# Patient Record
Sex: Female | Born: 1960 | Race: White | Hispanic: No | Marital: Married | Smoking: Never smoker
Health system: Southern US, Community
[De-identification: ages and names within clinical notes are randomized; demographics above are authoritative.]

## PROBLEM LIST (undated history)

## (undated) DIAGNOSIS — I2510 Atherosclerotic heart disease of native coronary artery without angina pectoris: Secondary | ICD-10-CM

## (undated) HISTORY — PX: SHOULDER SURGERY: SHX246

---

## 2005-11-25 ENCOUNTER — Ambulatory Visit: Payer: Self-pay | Admitting: Internal Medicine

## 2005-11-28 ENCOUNTER — Ambulatory Visit: Payer: Self-pay | Admitting: Surgery

## 2006-11-11 HISTORY — PX: SKIN BIOPSY: SHX1

## 2008-01-15 ENCOUNTER — Ambulatory Visit: Payer: Self-pay | Admitting: Specialist

## 2008-03-23 ENCOUNTER — Other Ambulatory Visit: Payer: Self-pay

## 2008-03-23 ENCOUNTER — Ambulatory Visit: Payer: Self-pay | Admitting: Specialist

## 2008-03-30 ENCOUNTER — Ambulatory Visit: Payer: Self-pay | Admitting: Specialist

## 2009-07-22 ENCOUNTER — Emergency Department: Payer: Self-pay | Admitting: Emergency Medicine

## 2011-10-11 ENCOUNTER — Emergency Department: Payer: Self-pay | Admitting: Internal Medicine

## 2013-06-11 ENCOUNTER — Emergency Department: Payer: Self-pay | Admitting: Emergency Medicine

## 2013-06-11 LAB — COMPREHENSIVE METABOLIC PANEL
Albumin: 3.8 g/dL (ref 3.4–5.0)
Alkaline Phosphatase: 123 U/L (ref 50–136)
Anion Gap: 3 — ABNORMAL LOW (ref 7–16)
Bilirubin,Total: 0.4 mg/dL (ref 0.2–1.0)
Calcium, Total: 9.1 mg/dL (ref 8.5–10.1)
Chloride: 106 mmol/L (ref 98–107)
Co2: 30 mmol/L (ref 21–32)
Creatinine: 0.74 mg/dL (ref 0.60–1.30)
Glucose: 97 mg/dL (ref 65–99)
Osmolality: 277 (ref 275–301)
Potassium: 3.8 mmol/L (ref 3.5–5.1)
SGOT(AST): 27 U/L (ref 15–37)
Sodium: 139 mmol/L (ref 136–145)

## 2013-06-11 LAB — CBC
HCT: 38.7 % (ref 35.0–47.0)
MCH: 30.6 pg (ref 26.0–34.0)
WBC: 8 10*3/uL (ref 3.6–11.0)

## 2013-06-11 LAB — TROPONIN I: Troponin-I: <0.02 ng/mL

## 2013-06-11 LAB — LIPASE, BLOOD: Lipase: 109 U/L (ref 73–393)

## 2016-11-16 ENCOUNTER — Encounter: Payer: Self-pay | Admitting: Emergency Medicine

## 2016-11-16 ENCOUNTER — Emergency Department: Payer: 59

## 2016-11-16 ENCOUNTER — Emergency Department
Admission: EM | Admit: 2016-11-16 | Discharge: 2016-11-17 | Disposition: A | Discharge status: Home / Self Care | Payer: 59 | Attending: Emergency Medicine | Admitting: Emergency Medicine

## 2016-11-16 DIAGNOSIS — I10 Essential (primary) hypertension: Secondary | ICD-10-CM | POA: Diagnosis not present

## 2016-11-16 DIAGNOSIS — J069 Acute upper respiratory infection, unspecified: Secondary | ICD-10-CM | POA: Insufficient documentation

## 2016-11-16 DIAGNOSIS — I2510 Atherosclerotic heart disease of native coronary artery without angina pectoris: Secondary | ICD-10-CM | POA: Diagnosis not present

## 2016-11-16 DIAGNOSIS — B9789 Other viral agents as the cause of diseases classified elsewhere: Secondary | ICD-10-CM

## 2016-11-16 DIAGNOSIS — R05 Cough: Secondary | ICD-10-CM | POA: Diagnosis present

## 2016-11-16 HISTORY — DX: Atherosclerotic heart disease of native coronary artery without angina pectoris: I25.10

## 2016-11-16 LAB — BASIC METABOLIC PANEL
Anion gap: 7 (ref 5–15)
BUN: 12 mg/dL (ref 6–20)
CALCIUM: 8.8 mg/dL — AB (ref 8.9–10.3)
CHLORIDE: 105 mmol/L (ref 101–111)
CO2: 27 mmol/L (ref 22–32)
CREATININE: 0.57 mg/dL (ref 0.44–1.00)
GFR calc Af Amer: >60 mL/min (ref >60)
GFR calc non Af Amer: >60 mL/min (ref >60)
Glucose, Bld: 102 mg/dL — ABNORMAL HIGH (ref 65–99)
Potassium: 3.5 mmol/L (ref 3.5–5.1)
Sodium: 139 mmol/L (ref 135–145)

## 2016-11-16 LAB — CBC
HCT: 39.8 % (ref 35.0–47.0)
Hemoglobin: 13.4 g/dL (ref 12.0–16.0)
MCH: 30.8 pg (ref 26.0–34.0)
MCHC: 33.6 g/dL (ref 32.0–36.0)
MCV: 91.6 fL (ref 80.0–100.0)
PLATELETS: 255 10*3/uL (ref 150–440)
RBC: 4.35 MIL/uL (ref 3.80–5.20)
RDW: 13.2 % (ref 11.5–14.5)
WBC: 9.4 10*3/uL (ref 3.6–11.0)

## 2016-11-16 LAB — TROPONIN I: Troponin I: <0.03 ng/mL (ref <0.03)

## 2016-11-16 MED ORDER — ASPIRIN 81 MG PO CHEW
324.0000 mg | CHEWABLE_TABLET | Freq: Once | ORAL | Status: AC
  Administered 2016-11-17: 324 mg via ORAL
  Filled 2016-11-16: qty 4

## 2016-11-16 NOTE — ED Notes (Signed)
Per EDT Edison Nasuti patient needs labs drawn

## 2016-11-16 NOTE — ED Provider Notes (Signed)
[facility] Emergency Department Provider Note  ____________________________________________  Time seen: Approximately 11:43 PM  I have reviewed the triage vital signs and the nursing notes.   HISTORY  Chief Complaint Hypertension   HPI Tracey Fischer is a 56 y.o. female who presents for evaluation of elevated blood pressure. Patient reports that she was diagnosed with the flu during Christmas time. She was feeling better however the last 2 days she started having cough productive of yellow sputum and subjective fevers. She went to [facility] today to request antibiotics for her symptoms and she was found to be hypertensive with systolics in the [id number] and was sent to the emergency room for further evaluation. Patient is seen by Dr. Nehemiah Massed and reports that she had a Holter monitor and a stress test done within the last year. Unfortunately I am unable to see these results on Epic. She tells me that her stress test was normal and her Holter monitor showed some type of arrhythmia. Patient was not placed on any medications for blood pressure or the arrhythmia according to her. She has never had a left heart catheterization. She denies family history of cardiac disease, she is not a smoker. Patient endorses that she has ongoing chest tightness that happens every time she has fluttering of her heart. She reports that she has these episodes on a daily basis. No chest tightness at this time. She drinks 2 cups of coffee a day. No history of thyroid disease. These episodes have been going on for over a year now with no recent changes in their frequency or intensity.  Past Medical History:  Diagnosis Date  . Coronary artery disease     There are no active problems to display for this patient.   Past Surgical History:  Procedure Laterality Date  . CESAREAN SECTION    . SHOULDER SURGERY Left     Prior to Admission medications   Not on File    Allergies Patient  has no allergy information on record.  History reviewed. No pertinent family history.  Social History Social History  Substance Use Topics  . Smoking status: Never Smoker  . Smokeless tobacco: Never Used  . Alcohol use Not on file     Comment: rarely    Review of Systems  Constitutional: Negative for fever. Eyes: Negative for visual changes. ENT: Negative for sore throat. Neck: No neck pain  Cardiovascular: Negative for chest pain. Respiratory: Negative for shortness of breath. Gastrointestinal: Negative for abdominal pain, vomiting or diarrhea. Genitourinary: Negative for dysuria. Musculoskeletal: Negative for back pain. Skin: Negative for rash. Neurological: Negative for headaches, weakness or numbness. Psych: No SI or HI  ____________________________________________   PHYSICAL EXAM:  VITAL SIGNS: ED Triage Vitals  Enc Vitals Group     BP 11/16/16 1807 (!) 164/99     Pulse Rate 11/16/16 1807 94     Resp 11/16/16 1807 20     Temp 11/16/16 1807 98 F (36.7 C)     Temp Source 11/16/16 1807 Oral     SpO2 11/16/16 1807 100 %     Weight 11/16/16 1807 195 lb (88.5 kg)     Height 11/16/16 1807 5\' 3"  (1.6 m)     Head Circumference --      Peak Flow --      Pain Score 11/16/16 1808 2     Pain Loc --      Pain Edu? --      Excl. in [place]? --  Constitutional: Alert and oriented. Well appearing and in no apparent distress. HEENT:      Head: Normocephalic and atraumatic.         Eyes: Conjunctivae are normal. Sclera is non-icteric. EOMI. PERRL      Mouth/Throat: Mucous membranes are moist.       Neck: Supple with no signs of meningismus. Cardiovascular: Regular rate and rhythm. No murmurs, gallops, or rubs. 2+ symmetrical distal pulses are present in all extremities. No JVD. Respiratory: Normal respiratory effort. Lungs are clear to auscultation bilaterally. No wheezes, crackles, or rhonchi.  Gastrointestinal: Soft, non tender, and non distended with positive bowel  sounds. No rebound or guarding. Musculoskeletal: Nontender with normal range of motion in all extremities. No edema, cyanosis, or erythema of extremities. Neurologic: Normal speech and language. Face is symmetric. Moving all extremities. No gross focal neurologic deficits are appreciated. Skin: Skin is warm, dry and intact. No rash noted. Psychiatric: Mood and affect are normal. Speech and behavior are normal.  ____________________________________________   LABS (all labs ordered are listed, but only abnormal results are displayed)  Labs Reviewed  BASIC METABOLIC PANEL - Abnormal; Notable for the following:       Result Value   Glucose, Bld 102 (*)    Calcium 8.8 (*)    All other components within normal limits  CBC  TROPONIN I  TROPONIN I   ____________________________________________  EKG  ED ECG REPORT I, Rudene Re, the attending physician, personally viewed and interpreted this ECG.  Normal sinus rhythm, rate of 75, normal intervals, normal axis, no ST elevations or depressions. Unchanged from prior. ____________________________________________  RADIOLOGY  CXR:  Negative ____________________________________________   PROCEDURES  Procedure(s) performed: None Procedures Critical Care performed:  None ____________________________________________   INITIAL IMPRESSION / ASSESSMENT AND PLAN / ED COURSE  56 y.o. female who presents for evaluation of elevated blood pressure in the setting of two days of productive cough. Patient has no chest pain at this time, no headache, no back pain. Her vital show BP of 164/99. Her EKG with no ischemic changes. First troponin is negative. Chest x-ray with no evidence of pneumonia, lungs are clear to auscultation with normal work of breathing, no fever or leukocytosis therefore will not treat with abx. Patient is CP free  At this time. Will get 2nd troponin and if negative will dc home with f/u with Dr. Nehemiah Massed, patient's  cardiologist.  Clinical Course as of Nov 17 217  Nancy Fetter Nov 17, 2016  0216 Chest x-ray with no evidence of pneumonia, patient remains afebrile with normal pulse, normal white count. Presentation concerning for viral URI. We'll hold off on antibiotics at this time. Troponin 2 is negative. Recommended the patient follow-up with her PCP and Dr. Nehemiah Massed for further evaluation of these episodes of palpitations and chest tightness. Recommended that she return to the emergency room she has a fever, body aches, or any new symptoms concerning to her.  [CV]    Clinical Course User Index [CV] Rudene Re, MD    Pertinent labs & imaging results that were available during my care of the patient were reviewed by me and considered in my medical decision making (see chart for details).    ____________________________________________   FINAL CLINICAL IMPRESSION(S) / ED DIAGNOSES  Final diagnoses:  Viral URI with cough  Hypertension, unspecified type      NEW MEDICATIONS STARTED DURING THIS VISIT:  New Prescriptions   No medications on file     Note:  This document was  prepared using Systems analyst and may include unintentional dictation errors.    Rudene Re, MD 11/17/16 [phone]

## 2016-11-16 NOTE — ED Triage Notes (Signed)
[facility] and being told her bp was 180/110. Pt states she has hx of cardiac issues and that she has "fluttering" often. She states she did have some tightness in her chest today but attributed it to her cough. She also states she has had a headache today, as well as feeling lightheaded. NAD noted.

## 2016-11-17 LAB — TROPONIN I: Troponin I: <0.03 ng/mL (ref <0.03)

## 2016-11-17 NOTE — Discharge Instructions (Signed)

## 2016-12-17 ENCOUNTER — Other Ambulatory Visit: Payer: Self-pay | Admitting: Family Medicine

## 2016-12-17 DIAGNOSIS — Z1231 Encounter for screening mammogram for malignant neoplasm of breast: Secondary | ICD-10-CM

## 2016-12-26 ENCOUNTER — Ambulatory Visit (INDEPENDENT_AMBULATORY_CARE_PROVIDER_SITE_OTHER): Payer: 59 | Admitting: Internal Medicine

## 2016-12-26 ENCOUNTER — Encounter: Payer: Self-pay | Admitting: Internal Medicine

## 2016-12-26 ENCOUNTER — Encounter (INDEPENDENT_AMBULATORY_CARE_PROVIDER_SITE_OTHER): Payer: Self-pay

## 2016-12-26 VITALS — BP 144/88 | HR 101 | Wt 201.0 lb

## 2016-12-26 DIAGNOSIS — R400 Somnolence: Secondary | ICD-10-CM

## 2016-12-26 DIAGNOSIS — J4530 Mild persistent asthma, uncomplicated: Secondary | ICD-10-CM | POA: Diagnosis not present

## 2016-12-26 MED ORDER — BUDESONIDE-FORMOTEROL FUMARATE 160-4.5 MCG/ACT IN AERO: 2.0000 | INHALATION_SPRAY | Freq: Two times a day (BID) | RESPIRATORY_TRACT | 6 refills | Status: DC

## 2016-12-26 MED ORDER — ALBUTEROL SULFATE HFA 108 (90 BASE) MCG/ACT IN AERS: 2.0000 | INHALATION_SPRAY | RESPIRATORY_TRACT | 6 refills | Status: DC | PRN

## 2016-12-26 NOTE — Progress Notes (Signed)
[facility] Consultation      Date: 12/26/2016,   MRN# [id number] Tracey Fischer 1961-04-28 Code Status:  Code Status History    This patient does not have a recorded code status. Please follow your organizational policy for patients in this situation.     Hosp day:@LENGTHOFSTAYDAYS @ Referring MD: @ATDPROV @     PCP:      AdmissionWeight: 201 lb (91.2 kg)                 CurrentWeight: 201 lb (91.2 kg) Tracey Fischer is a 56 y.o. old female seen in consultation for SOB at the request of Dr Kary Kos     CHIEF COMPLAINT:   SOB   HISTORY OF PRESENT ILLNESS  56 yo pleasant white female seen today for SOB -chronic SOB for many years SOB with exertion Intermittent chest tightness  intermittent cough and wheezing Resolves with rest  Has presumed flu like symptoms 2 months ago, recurrent Bronchitis and persistent nonproductive cough She has tried OTC meds tylenol and mucinex with little benefit She has chronic WOB and SOB with exertion-but no acute distress  She has gained 50 pounds in 5 years Has non-refreshed sleep has occasional morning headaches Has increased BP in last several months' Has daytime sleepiness and fatigue    PAST MEDICAL HISTORY   Past Medical History:  Diagnosis Date  . Coronary artery disease      SURGICAL HISTORY   Past Surgical History:  Procedure Laterality Date  . CESAREAN SECTION    . SHOULDER SURGERY Left      FAMILY HISTORY   History reviewed. No pertinent family history.  SISTER +ASTHMA  SIS SOCIAL HISTORY   Social History  Substance Use Topics  . Smoking status: Never Smoker  . Smokeless tobacco: Never Used  . Alcohol use Not on file     Comment: rarely     MEDICATIONS    Home Medication:  Current Outpatient Rx  . Order #: [id number] Class: Historical Med    Current Medication:  Current Outpatient Prescriptions:  .  vitamin B-12 (CYANOCOBALAMIN) 1000 MCG tablet, Take 1,000 mcg by mouth  daily., Disp: , Rfl:     ALLERGIES   Patient has no known allergies.     REVIEW OF SYSTEMS   Review of Systems  Constitutional: Positive for malaise/fatigue. Negative for chills, diaphoresis, fever and weight loss.  HENT: Negative for congestion and hearing loss.   Eyes: Negative for blurred vision and double vision.  Respiratory: Positive for cough and shortness of breath. Negative for hemoptysis, sputum production and wheezing.   Cardiovascular: Negative for chest pain, palpitations and orthopnea.  Gastrointestinal: Negative for abdominal pain, heartburn, nausea and vomiting.  Genitourinary: Negative for dysuria and urgency.  Musculoskeletal: Negative for back pain, myalgias and neck pain.  Skin: Negative for rash.  Neurological: Negative for dizziness, tingling, tremors, weakness and headaches.  Endo/Heme/Allergies: Does not bruise/bleed easily.  Psychiatric/Behavioral: Negative for depression, substance abuse and suicidal ideas.  All other systems reviewed and are negative.    VS: BP (!) 144/88 (BP Location: Left Arm, Cuff Size: Normal)   Pulse (!) 101   Wt 201 lb (91.2 kg)   SpO2 98%   BMI 35.61 kg/m      PHYSICAL EXAM  Physical Exam  Constitutional: She is oriented to person, place, and time. She appears well-developed and well-nourished. No distress.  HENT:  Head: Normocephalic and atraumatic.  Mouth/Throat: No oropharyngeal exudate.  Eyes: EOM are normal. Pupils are  equal, round, and reactive to light. No scleral icterus.  Neck: Normal range of motion. Neck supple.  Cardiovascular: Normal rate, regular rhythm and normal heart sounds.   No murmur heard. Pulmonary/Chest: No stridor. No respiratory distress. She has no wheezes.  Abdominal: Soft. Bowel sounds are normal.  Musculoskeletal: Normal range of motion. She exhibits no edema.  Neurological: She is alert and oriented to person, place, and time. No cranial nerve deficit.  Skin: Skin is warm. She is not  diaphoretic.  Psychiatric: She has a normal mood and affect.            IMAGING   CXR 11/2016 No acutue process no effusions, no opacities Images reviewed 12/26/2016  CXR  ASSESSMENT/PLAN   56 yo pleasant overweight WF with signs and symptoms of intermittent reactive airways disease with persistent viral infections in the setting of increased weight gain and probable underlying OSA  1.check sleep study with ONO 2.start symbicort and albuterol as needed 3.recommend weight loss, diet and exercise  Follow up in 4 weeks   I have personally obtained a history, examined the patient, evaluated laboratory and independently reviewed imaging results, formulated the assessment and plan and placed orders.  The Patient requires high complexity decision making for assessment and support, frequent evaluation and titration of therapies, application of advanced monitoring technologies and extensive interpretation of multiple databases.   Patient  satisfied with Plan of action and management. All questions answered  Corrin Parker, M.D.  [facility]  Medical Director [place] Director [facility] Cardio-Pulmonary Department

## 2016-12-26 NOTE — Patient Instructions (Signed)
Start Symbicort Inhalers Albuterol as needed Will need Split Night Study

## 2016-12-30 ENCOUNTER — Encounter: Payer: Self-pay | Admitting: Internal Medicine

## 2016-12-30 DIAGNOSIS — J4530 Mild persistent asthma, uncomplicated: Secondary | ICD-10-CM

## 2017-01-06 ENCOUNTER — Encounter: Payer: Self-pay | Admitting: Radiology

## 2017-01-06 ENCOUNTER — Ambulatory Visit
Admission: RE | Admit: 2017-01-06 | Discharge: 2017-01-06 | Disposition: A | Discharge status: Home / Self Care | Payer: 59 | Source: Ambulatory Visit | Attending: Family Medicine | Admitting: Family Medicine

## 2017-01-06 DIAGNOSIS — Z1231 Encounter for screening mammogram for malignant neoplasm of breast: Secondary | ICD-10-CM

## 2017-01-08 ENCOUNTER — Telehealth: Payer: Self-pay | Admitting: *Deleted

## 2017-01-08 NOTE — Telephone Encounter (Signed)
Pt informed ONO was negative. nothing further needed.

## 2017-01-14 ENCOUNTER — Other Ambulatory Visit: Payer: Self-pay | Admitting: Family Medicine

## 2017-01-14 DIAGNOSIS — R928 Other abnormal and inconclusive findings on diagnostic imaging of breast: Secondary | ICD-10-CM

## 2017-01-14 DIAGNOSIS — N6310 Unspecified lump in the right breast, unspecified quadrant: Secondary | ICD-10-CM

## 2017-01-21 ENCOUNTER — Ambulatory Visit
Admission: RE | Admit: 2017-01-21 | Discharge: 2017-01-21 | Disposition: A | Discharge status: Home / Self Care | Payer: 59 | Source: Ambulatory Visit | Attending: Family Medicine | Admitting: Family Medicine

## 2017-01-21 DIAGNOSIS — R928 Other abnormal and inconclusive findings on diagnostic imaging of breast: Secondary | ICD-10-CM

## 2017-01-21 DIAGNOSIS — N63 Unspecified lump in unspecified breast: Secondary | ICD-10-CM | POA: Diagnosis present

## 2017-01-21 DIAGNOSIS — N6310 Unspecified lump in the right breast, unspecified quadrant: Secondary | ICD-10-CM

## 2017-01-22 ENCOUNTER — Telehealth: Payer: Self-pay | Admitting: Internal Medicine

## 2017-01-22 DIAGNOSIS — G4719 Other hypersomnia: Secondary | ICD-10-CM

## 2017-01-22 NOTE — Telephone Encounter (Signed)
Order placed for HST since that is insurance preference. Nothing further needed.

## 2017-01-22 NOTE — Telephone Encounter (Signed)
Ria Comment with [facility] called and stated that patient's insurance prefers that patient have a HST instead of an in lab study.  Please cancel order for in lab and place HST order.  Thanks. Rhonda J Cobb

## 2018-09-04 ENCOUNTER — Other Ambulatory Visit (HOSPITAL_COMMUNITY): Payer: Self-pay | Admitting: Internal Medicine

## 2018-09-04 DIAGNOSIS — R0789 Other chest pain: Secondary | ICD-10-CM

## 2018-09-25 ENCOUNTER — Ambulatory Visit (HOSPITAL_COMMUNITY)
Admission: RE | Admit: 2018-09-25 | Discharge: 2018-09-25 | Disposition: A | Discharge status: Home / Self Care | Payer: 59 | Source: Ambulatory Visit | Attending: Internal Medicine | Admitting: Internal Medicine

## 2018-11-26 ENCOUNTER — Telehealth (HOSPITAL_COMMUNITY): Payer: Self-pay | Admitting: Emergency Medicine

## 2018-11-26 NOTE — Telephone Encounter (Signed)
Reaching out to patient to offer assistance regarding upcoming cardiac imaging study; pt verbalizes understanding of appt date/time, parking situation and where to check in, and verified current allergies; name and call back number provided for further questions should they Darnelle Maffucci RN [place] [phone]

## 2018-11-27 ENCOUNTER — Ambulatory Visit (HOSPITAL_COMMUNITY)
Admission: RE | Admit: 2018-11-27 | Discharge: 2018-11-27 | Disposition: A | Discharge status: Home / Self Care | Payer: 59 | Source: Ambulatory Visit | Attending: Internal Medicine | Admitting: Internal Medicine

## 2018-11-27 DIAGNOSIS — R0789 Other chest pain: Secondary | ICD-10-CM | POA: Insufficient documentation

## 2018-11-27 DIAGNOSIS — R0600 Dyspnea, unspecified: Secondary | ICD-10-CM

## 2018-11-27 MED ORDER — GADOBUTROL 1 MMOL/ML IV SOLN
10.0000 mL | Freq: Once | INTRAVENOUS | Status: AC | PRN
  Administered 2018-11-27: 10 mL via INTRAVENOUS

## 2021-06-12 ENCOUNTER — Encounter: Payer: Self-pay | Admitting: Dermatology

## 2021-06-12 ENCOUNTER — Other Ambulatory Visit: Payer: Self-pay

## 2021-06-12 ENCOUNTER — Ambulatory Visit: Payer: BC Managed Care – PPO | Admitting: Dermatology

## 2021-06-12 DIAGNOSIS — L578 Other skin changes due to chronic exposure to nonionizing radiation: Secondary | ICD-10-CM

## 2021-06-12 DIAGNOSIS — D1800 Hemangioma unspecified site: Secondary | ICD-10-CM | POA: Diagnosis not present

## 2021-06-12 DIAGNOSIS — L821 Other seborrheic keratosis: Secondary | ICD-10-CM | POA: Diagnosis not present

## 2021-06-12 DIAGNOSIS — Z1283 Encounter for screening for malignant neoplasm of skin: Secondary | ICD-10-CM | POA: Diagnosis not present

## 2021-06-12 DIAGNOSIS — L814 Other melanin hyperpigmentation: Secondary | ICD-10-CM

## 2021-06-12 NOTE — Patient Instructions (Addendum)
Seborrheic Keratosis  What causes seborrheic keratoses? Seborrheic keratoses are harmless, common skin growths that first appear during adult life.  As time goes by, more growths appear.  Some people may develop a large number of them.  Seborrheic keratoses appear on both covered and uncovered body parts.  They are not caused by sunlight.  The tendency to develop seborrheic keratoses can be inherited.  They vary in color from skin-colored to gray, brown, or even black.  They can be either smooth or have a rough, warty surface.   Seborrheic keratoses are superficial and look as if they were stuck on the skin.  Under the microscope this type of keratosis looks like layers upon layers of skin.  That is why at times the top layer may seem to fall off, but the rest of the growth remains and re-grows.    Treatment Seborrheic keratoses do not need to be treated, but can easily be removed in the office.  Seborrheic keratoses often cause symptoms when they rub on clothing or jewelry.  Lesions can be in the way of shaving.  If they become inflamed, they can cause itching, soreness, or burning.  Removal of a seborrheic keratosis can be accomplished by freezing, burning, or surgery. If any spot bleeds, scabs, or grows rapidly, please return to have it checked, as these can be an indication of a skin cancer.  Recommend daily broad spectrum sunscreen SPF 30+ to sun-exposed areas, reapply every 2 hours as needed. Call for new or changing lesions.  Staying in the shade or wearing long sleeves, sun glasses (UVA+UVB protection) and wide brim hats (4-inch brim around the entire circumference of the hat) are also recommended for sun protection.    If you have any questions or concerns for your doctor, please call our main line at [phone] and press option 4 to reach your doctor's medical assistant. If no one answers, please leave a voicemail as directed and we will return your call as soon as possible. Messages left  after 4 pm will be answered the following business day.   You may also send [place] a message via [place]. We typically respond to MyChart messages within 1-2 business days.  For prescription refills, please ask your pharmacy to contact our office. Our fax number is [phone].  If you have an urgent issue when the clinic is closed that cannot wait until the next business day, you can page your doctor at the number below.    Please note that while we do our best to be available for urgent issues outside of office hours, we are not available 24/7.   If you have an urgent issue and are unable to reach [place], you may choose to seek medical care at your doctor's office, retail clinic, urgent care center, or emergency room.  If you have a medical emergency, please immediately call 911 or go to the emergency department.  Pager Numbers  - Dr. Nehemiah Massed: [phone]  - Dr. Laurence Ferrari: [phone]  - Dr. Nicole Kindred: [phone]  In the event of inclement weather, please call our main line at [phone] for an update on the status of any delays or closures.  Dermatology Medication Tips: Please keep the boxes that topical medications come in in order to help keep track of the instructions about where and how to use these. Pharmacies typically print the medication instructions only on the boxes and not directly on the medication tubes.   If your medication is too expensive, please contact our office at  [phone] option 4 or send [place] a message through [place].   We are unable to tell what your co-pay for medications will be in advance as this is different depending on your insurance coverage. However, we may be able to find a substitute medication at lower cost or fill out paperwork to get insurance to cover a needed medication.   If a prior authorization is required to get your medication covered by your insurance company, please allow [place] 1-2 business days to complete this process.  Drug prices often  vary depending on where the prescription is filled and some pharmacies may offer cheaper prices.  The website www.goodrx.com contains coupons for medications through different pharmacies. The prices here do not account for what the cost may be with help from insurance (it may be cheaper with your insurance), but the website can give you the price if you did not use any insurance.  - You can print the associated coupon and take it with your prescription to the pharmacy.  - You may also stop by our office during regular business hours and pick up a GoodRx coupon card.  - If you need your prescription sent electronically to a different pharmacy, notify our office through [place] or by phone at [phone] option 4.
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
## 2022-03-13 ENCOUNTER — Other Ambulatory Visit: Payer: Self-pay | Admitting: Family Medicine

## 2022-03-13 DIAGNOSIS — N6310 Unspecified lump in the right breast, unspecified quadrant: Secondary | ICD-10-CM

## 2022-04-03 ENCOUNTER — Ambulatory Visit
Admission: RE | Admit: 2022-04-03 | Discharge: 2022-04-03 | Disposition: A | Discharge status: Home / Self Care | Payer: BC Managed Care – PPO | Source: Ambulatory Visit | Attending: Family Medicine | Admitting: Family Medicine

## 2022-04-03 DIAGNOSIS — N6310 Unspecified lump in the right breast, unspecified quadrant: Secondary | ICD-10-CM

## 2022-06-18 ENCOUNTER — Ambulatory Visit: Payer: BC Managed Care – PPO | Admitting: Dermatology

## 2022-06-18 DIAGNOSIS — D1800 Hemangioma unspecified site: Secondary | ICD-10-CM

## 2022-06-18 DIAGNOSIS — D3617 Benign neoplasm of peripheral nerves and autonomic nervous system of trunk, unspecified: Secondary | ICD-10-CM

## 2022-06-18 DIAGNOSIS — Z1283 Encounter for screening for malignant neoplasm of skin: Secondary | ICD-10-CM | POA: Diagnosis not present

## 2022-06-18 DIAGNOSIS — S80862A Insect bite (nonvenomous), left lower leg, initial encounter: Secondary | ICD-10-CM

## 2022-06-18 DIAGNOSIS — W57XXXA Bitten or stung by nonvenomous insect and other nonvenomous arthropods, initial encounter: Secondary | ICD-10-CM

## 2022-06-18 DIAGNOSIS — L578 Other skin changes due to chronic exposure to nonionizing radiation: Secondary | ICD-10-CM

## 2022-06-18 DIAGNOSIS — L820 Inflamed seborrheic keratosis: Secondary | ICD-10-CM | POA: Diagnosis not present

## 2022-06-18 DIAGNOSIS — D229 Melanocytic nevi, unspecified: Secondary | ICD-10-CM

## 2022-06-18 DIAGNOSIS — I8393 Asymptomatic varicose veins of bilateral lower extremities: Secondary | ICD-10-CM

## 2022-06-18 DIAGNOSIS — L821 Other seborrheic keratosis: Secondary | ICD-10-CM

## 2022-06-18 DIAGNOSIS — D3610 Benign neoplasm of peripheral nerves and autonomic nervous system, unspecified: Secondary | ICD-10-CM

## 2022-06-18 DIAGNOSIS — L814 Other melanin hyperpigmentation: Secondary | ICD-10-CM

## 2022-06-18 NOTE — Patient Instructions (Addendum)
Seborrheic Keratosis  What causes seborrheic keratoses? Seborrheic keratoses are harmless, common skin growths that first appear during adult life.  As time goes by, more growths appear.  Some people may develop a large number of them.  Seborrheic keratoses appear on both covered and uncovered body parts.  They are not caused by sunlight.  The tendency to develop seborrheic keratoses can be inherited.  They vary in color from skin-colored to gray, brown, or even black.  They can be either smooth or have a rough, warty surface.   Seborrheic keratoses are superficial and look as if they were stuck on the skin.  Under the microscope this type of keratosis looks like layers upon layers of skin.  That is why at times the top layer may seem to fall off, but the rest of the growth remains and re-grows.    Treatment Seborrheic keratoses do not need to be treated, but can easily be removed in the office.  Seborrheic keratoses often cause symptoms when they rub on clothing or jewelry.  Lesions can be in the way of shaving.  If they become inflamed, they can cause itching, soreness, or burning.  Removal of a seborrheic keratosis can be accomplished by freezing, burning, or surgery. If any spot bleeds, scabs, or grows rapidly, please return to have it checked, as these can be an indication of a skin cancer.  Cryotherapy Aftercare  Wash gently with soap and water everyday.   Apply Vaseline and Band-Aid daily until healed.       Melanoma ABCDEs  Melanoma is the most dangerous type of skin cancer, and is the leading cause of death from skin disease.  You are more likely to develop melanoma if you: Have light-colored skin, light-colored eyes, or red or blond hair Spend a lot of time in the sun Tan regularly, either outdoors or in a tanning bed Have had blistering sunburns, especially during childhood Have a close family member who has had a melanoma Have atypical moles or large birthmarks  Early  detection of melanoma is key since treatment is typically straightforward and cure rates are extremely high if we catch it early.   The first sign of melanoma is often a change in a mole or a new dark spot.  The ABCDE system is a way of remembering the signs of melanoma.  A for asymmetry:  The two halves do not match. B for border:  The edges of the growth are irregular. C for color:  A mixture of colors are present instead of an even brown color. D for diameter:  Melanomas are usually (but not always) greater than 6mm - the size of a pencil eraser. E for evolution:  The spot keeps changing in size, shape, and color.  Please check your skin once per month between visits. You can use a small mirror in front and a large mirror behind you to keep an eye on the back side or your body.   If you see any new or changing lesions before your next follow-up, please call to schedule a visit.  Please continue daily skin protection including broad spectrum sunscreen SPF 30+ to sun-exposed areas, reapplying every 2 hours as needed when you're outdoors.   Staying in the shade or wearing long sleeves, sun glasses (UVA+UVB protection) and wide brim hats (4-inch brim around the entire circumference of the hat) are also recommended for sun protection.     Due to recent changes in healthcare laws, you may see results of   your pathology and/or laboratory studies on MyChart before the doctors have had a chance to review them. We understand that in some cases there may be results that are confusing or concerning to you. Please understand that not all results are received at the same time and often the doctors may need to interpret multiple results in order to provide you with the best plan of care or course of treatment. Therefore, we ask that you please give us 2 business days to thoroughly review all your results before contacting the office for clarification. Should we see a critical lab result, you will be contacted  sooner.   If You Need Anything After Your Visit  If you have any questions or concerns for your doctor, please call our main line at [phone] and press option 4 to reach your doctor's medical assistant. If no one answers, please leave a voicemail as directed and we will return your call as soon as possible. Messages left after 4 pm will be answered the following business day.   You may also send us a message via MyChart. We typically respond to MyChart messages within 1-2 business days.  For prescription refills, please ask your pharmacy to contact our office. Our fax number is [phone].  If you have an urgent issue when the clinic is closed that cannot wait until the next business day, you can page your doctor at the number below.    Please note that while we do our best to be available for urgent issues outside of office hours, we are not available 24/7.   If you have an urgent issue and are unable to reach us, you may choose to seek medical care at your doctor's office, retail clinic, urgent care center, or emergency room.  If you have a medical emergency, please immediately call 911 or go to the emergency department.  Pager Numbers  - Dr. Kowalski: [phone]  - Dr. Moye: [phone]  - Dr. Stewart: [phone]  In the event of inclement weather, please call our main line at [phone] for an update on the status of any delays or closures.  Dermatology Medication Tips: Please keep the boxes that topical medications come in in order to help keep track of the instructions about where and how to use these. Pharmacies typically print the medication instructions only on the boxes and not directly on the medication tubes.   If your medication is too expensive, please contact our office at [phone] option 4 or send us a message through MyChart.   We are unable to tell what your co-pay for medications will be in advance as this is different depending on your insurance  coverage. However, we may be able to find a substitute medication at lower cost or fill out paperwork to get insurance to cover a needed medication.   If a prior authorization is required to get your medication covered by your insurance company, please allow us 1-2 business days to complete this process.  Drug prices often vary depending on where the prescription is filled and some pharmacies may offer cheaper prices.  The website www.goodrx.com contains coupons for medications through different pharmacies. The prices here do not account for what the cost may be with help from insurance (it may be cheaper with your insurance), but the website can give you the price if you did not use any insurance.  - You can print the associated coupon and take it with your prescription to the pharmacy.  - You may also stop   by our office during regular business hours and pick up a GoodRx coupon card.  - If you need your prescription sent electronically to a different pharmacy, notify our office through [facility] MyChart or by phone at [phone] option 4.     Si Usted Necesita Algo Despus de Su Visita  Tambin puede enviarnos un mensaje a travs de MyChart. Por lo general respondemos a los mensajes de MyChart en el transcurso de 1 a 2 das hbiles.  Para renovar recetas, por favor pida a su farmacia que se ponga en contacto con nuestra oficina. Nuestro nmero de fax es el [phone].  Si tiene un asunto urgente cuando la clnica est cerrada y que no puede esperar hasta el siguiente da hbil, puede llamar/localizar a su doctor(a) al nmero que aparece a continuacin.   Por favor, tenga en cuenta que aunque hacemos todo lo posible para estar disponibles para asuntos urgentes fuera del horario de oficina, no estamos disponibles las 24 horas del da, los 7 das de la semana.   Si tiene un problema urgente y no puede comunicarse con nosotros, puede optar por buscar atencin mdica  en el consultorio de  su doctor(a), en una clnica privada, en un centro de atencin urgente o en una sala de emergencias.  Si tiene una emergencia mdica, por favor llame inmediatamente al 911 o vaya a la sala de emergencias.  Nmeros de bper  - Dr. Kowalski: [phone]  - Dra. Moye: [phone]  - Dra. Stewart: [phone]  En caso de inclemencias del tiempo, por favor llame a nuestra lnea principal al [phone] para una actualizacin sobre el estado de cualquier retraso o cierre.  Consejos para la medicacin en dermatologa: Por favor, guarde las cajas en las que vienen los medicamentos de uso tpico para ayudarle a seguir las instrucciones sobre dnde y cmo usarlos. Las farmacias generalmente imprimen las instrucciones del medicamento slo en las cajas y no directamente en los tubos del medicamento.   Si su medicamento es muy caro, por favor, pngase en contacto con nuestra oficina llamando al [phone] y presione la opcin 4 o envenos un mensaje a travs de MyChart.   No podemos decirle cul ser su copago por los medicamentos por adelantado ya que esto es diferente dependiendo de la cobertura de su seguro. Sin embargo, es posible que podamos encontrar un medicamento sustituto a menor costo o llenar un formulario para que el seguro cubra el medicamento que se considera necesario.   Si se requiere una autorizacin previa para que su compaa de seguros cubra su medicamento, por favor permtanos de 1 a 2 das hbiles para completar este proceso.  Los precios de los medicamentos varan con frecuencia dependiendo del lugar de dnde se surte la receta y alguna farmacias pueden ofrecer precios ms baratos.  El sitio web www.goodrx.com tiene cupones para medicamentos de diferentes farmacias. Los precios aqu no tienen en cuenta lo que podra costar con la ayuda del seguro (puede ser ms barato con su seguro), pero el sitio web puede darle el precio si no utiliz ningn seguro.  - Puede imprimir el  cupn correspondiente y llevarlo con su receta a la farmacia.  - Tambin puede pasar por nuestra oficina durante el horario de atencin regular y recoger una tarjeta de cupones de GoodRx.  - Si necesita que su receta se enve electrnicamente a una farmacia diferente, informe a nuestra oficina a travs de MyChart de [facility] o por telfono llamando al [phone] y presione la opcin 4.  
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
## 2022-08-20 ENCOUNTER — Ambulatory Visit: Payer: BC Managed Care – PPO | Admitting: Dermatology

## 2023-03-13 ENCOUNTER — Other Ambulatory Visit: Payer: Self-pay | Admitting: Family Medicine

## 2023-03-13 ENCOUNTER — Ambulatory Visit
Admission: RE | Admit: 2023-03-13 | Discharge: 2023-03-13 | Disposition: A | Discharge status: Home / Self Care | Payer: BC Managed Care – PPO | Source: Ambulatory Visit | Attending: Family Medicine | Admitting: Family Medicine

## 2023-03-13 DIAGNOSIS — G4485 Primary stabbing headache: Secondary | ICD-10-CM

## 2023-04-08 ENCOUNTER — Other Ambulatory Visit: Payer: Self-pay | Admitting: Neurology

## 2023-04-08 DIAGNOSIS — Z8249 Family history of ischemic heart disease and other diseases of the circulatory system: Secondary | ICD-10-CM

## 2023-04-08 DIAGNOSIS — R519 Headache, unspecified: Secondary | ICD-10-CM

## 2023-07-08 ENCOUNTER — Encounter: Payer: BC Managed Care – PPO | Admitting: Dermatology

## 2023-08-24 ENCOUNTER — Emergency Department: Payer: BC Managed Care – PPO

## 2023-08-24 ENCOUNTER — Emergency Department
Admission: EM | Admit: 2023-08-24 | Discharge: 2023-08-24 | Disposition: A | Discharge status: Home / Self Care | Payer: BC Managed Care – PPO | Attending: Emergency Medicine | Admitting: Emergency Medicine

## 2023-08-24 ENCOUNTER — Other Ambulatory Visit: Payer: Self-pay

## 2023-08-24 DIAGNOSIS — R1032 Left lower quadrant pain: Secondary | ICD-10-CM | POA: Diagnosis not present

## 2023-08-24 DIAGNOSIS — R630 Anorexia: Secondary | ICD-10-CM | POA: Diagnosis not present

## 2023-08-24 DIAGNOSIS — R110 Nausea: Secondary | ICD-10-CM | POA: Insufficient documentation

## 2023-08-24 DIAGNOSIS — K5792 Diverticulitis of intestine, part unspecified, without perforation or abscess without bleeding: Secondary | ICD-10-CM

## 2023-08-24 DIAGNOSIS — R109 Unspecified abdominal pain: Secondary | ICD-10-CM | POA: Diagnosis present

## 2023-08-24 LAB — CBC
HCT: 43.2 % (ref 36.0–46.0)
Hemoglobin: 13.8 g/dL (ref 12.0–15.0)
MCH: 30.3 pg (ref 26.0–34.0)
MCHC: 31.9 g/dL (ref 30.0–36.0)
MCV: 94.9 fL (ref 80.0–100.0)
Platelets: 330 10*3/uL (ref 150–400)
RBC: 4.55 MIL/uL (ref 3.87–5.11)
RDW: 13.1 % (ref 11.5–15.5)
WBC: 17.4 10*3/uL — ABNORMAL HIGH (ref 4.0–10.5)
nRBC: 0 % (ref 0.0–0.2)

## 2023-08-24 LAB — COMPREHENSIVE METABOLIC PANEL
ALT: 34 U/L (ref 0–44)
AST: 44 U/L — ABNORMAL HIGH (ref 15–41)
Albumin: 3.9 g/dL (ref 3.5–5.0)
Alkaline Phosphatase: 114 U/L (ref 38–126)
Anion gap: 10 (ref 5–15)
BUN: 9 mg/dL (ref 8–23)
CO2: 25 mmol/L (ref 22–32)
Calcium: 8.8 mg/dL — ABNORMAL LOW (ref 8.9–10.3)
Chloride: 100 mmol/L (ref 98–111)
Creatinine, Ser: 0.66 mg/dL (ref 0.44–1.00)
GFR, Estimated: >60 mL/min (ref >60)
Glucose, Bld: 101 mg/dL — ABNORMAL HIGH (ref 70–99)
Potassium: 4.2 mmol/L (ref 3.5–5.1)
Sodium: 135 mmol/L (ref 135–145)
Total Bilirubin: 1.3 mg/dL — ABNORMAL HIGH (ref 0.3–1.2)
Total Protein: 8.3 g/dL — ABNORMAL HIGH (ref 6.5–8.1)

## 2023-08-24 LAB — URINALYSIS, ROUTINE W REFLEX MICROSCOPIC
Bilirubin Urine: NEGATIVE
Glucose, UA: NEGATIVE mg/dL
Hgb urine dipstick: NEGATIVE
Ketones, ur: NEGATIVE mg/dL
Leukocytes,Ua: NEGATIVE
Nitrite: NEGATIVE
Protein, ur: 30 mg/dL — AB
Specific Gravity, Urine: 1.029 (ref 1.005–1.030)
pH: 5 (ref 5.0–8.0)

## 2023-08-24 LAB — LIPASE, BLOOD: Lipase: 21 U/L (ref 11–51)

## 2023-08-24 MED ORDER — ONDANSETRON HCL 4 MG/2ML IJ SOLN: 4.0000 mg | INTRAMUSCULAR | Status: DC

## 2023-08-24 MED ORDER — MORPHINE SULFATE (PF) 4 MG/ML IV SOLN
4.0000 mg | Freq: Once | INTRAVENOUS | Status: AC
Start: 2023-08-24 — End: 2023-08-24
  Administered 2023-08-24: 4 mg via INTRAMUSCULAR
  Filled 2023-08-24: qty 1

## 2023-08-24 MED ORDER — METRONIDAZOLE 500 MG PO TABS: 500.0000 mg | ORAL_TABLET | Freq: Three times a day (TID) | ORAL | 0 refills | Status: AC

## 2023-08-24 MED ORDER — MORPHINE SULFATE (PF) 4 MG/ML IV SOLN
4.0000 mg | Freq: Once | INTRAVENOUS | Status: DC
Start: 2023-08-24 — End: 2023-08-24

## 2023-08-24 MED ORDER — ONDANSETRON 4 MG PO TBDP
4.0000 mg | ORAL_TABLET | Freq: Once | ORAL | Status: AC
  Administered 2023-08-24: 4 mg via ORAL
  Filled 2023-08-24: qty 1

## 2023-08-24 MED ORDER — PIPERACILLIN-TAZOBACTAM 3.375 G IVPB 30 MIN
3.3750 g | Freq: Once | INTRAVENOUS | Status: AC
  Administered 2023-08-24: 3.375 g via INTRAVENOUS
  Filled 2023-08-24: qty 50

## 2023-08-24 MED ORDER — ONDANSETRON 4 MG PO TBDP: 4.0000 mg | ORAL_TABLET | Freq: Three times a day (TID) | ORAL | 0 refills | Status: AC | PRN

## 2023-08-24 MED ORDER — OXYCODONE-ACETAMINOPHEN 5-325 MG PO TABS: 1.0000 | ORAL_TABLET | Freq: Four times a day (QID) | ORAL | 0 refills | Status: AC | PRN

## 2023-08-24 MED ORDER — IOHEXOL 300 MG/ML  SOLN
100.0000 mL | Freq: Once | INTRAMUSCULAR | Status: AC | PRN
Start: 2023-08-24 — End: 2023-08-24
  Administered 2023-08-24: 100 mL via INTRAVENOUS

## 2023-08-24 MED ORDER — CIPROFLOXACIN HCL 500 MG PO TABS: 500.0000 mg | ORAL_TABLET | Freq: Two times a day (BID) | ORAL | 0 refills | Status: AC

## 2023-08-24 NOTE — ED Triage Notes (Signed)
Pt comes with c/o belly pain since Friday. Pt states mid slower abdomen and radiates to back. Pt states it did started in LLQ. Pt states soreness.

## 2023-08-24 NOTE — ED Provider Notes (Signed)
[facility] Provider Note    Event Date/Time   First MD Initiated Contact with Patient 08/24/23 1726     (approximate)   History   Abdominal Pain   HPI Tracey Fischer is a 62 y.o. female who presents for evaluation of several days of gradually worsening abdominal pain.  She said that it started in her left lower quadrant and now is all throughout her abdomen but seems to be somewhat centralized around her bellybutton.  She has had a little bit of nausea today but no vomiting but she has lost her appetite and has not had much to eat or drink for a while.  She denies fever, chest pain, shortness of breath.  Initially she thought it would just get better with time and wondered if it was a urinary tract infection although she denies having any increased urinary frequency nor dysuria.  She has not had any diarrhea nor constipation.  Moving around or putting any pressure on her abdomen makes the pain worse.     Physical Exam   Triage Vital Signs: ED Triage Vitals  Encounter Vitals Group     BP 08/24/23 1508 (!) 152/75     Systolic BP Percentile --      Diastolic BP Percentile --      Pulse Rate 08/24/23 1508 95     Resp 08/24/23 1508 18     Temp 08/24/23 1508 98.3 F (36.8 C)     Temp src --      SpO2 08/24/23 1508 95 %     Weight --      Height --      Head Circumference --      Peak Flow --      Pain Score 08/24/23 1507 4     Pain Loc --      Pain Education --      Exclude from Growth Chart --     Most recent vital signs: Vitals:   08/24/23 1508  BP: (!) 152/75  Pulse: 95  Resp: 18  Temp: 98.3 F (36.8 C)  SpO2: 95%    General: Awake, appears uncomfortable but nontoxic. CV:  Good peripheral perfusion.  Regular rate and rhythm. Resp:  Normal effort. Speaking easily and comfortably, no accessory muscle usage nor intercostal retractions.   Abd:  No distention.  Exquisite tenderness to palpation throughout the abdomen concerning for  localized peritonitis around umbilicus.  She is tender to palpation both on the left and lower quadrants but guards and gasps with palpation to the middle of her abdomen.  No significant tenderness in the epigastrium nor the right upper quadrant.  She has rebound tenderness as well.   ED Results / Procedures / Treatments   Labs (all labs ordered are listed, but only abnormal results are displayed) Labs Reviewed  COMPREHENSIVE METABOLIC PANEL - Abnormal; Notable for the following components:      Result Value   Glucose, Bld 101 (*)    Calcium 8.8 (*)    Total Protein 8.3 (*)    AST 44 (*)    Total Bilirubin 1.3 (*)    All other components within normal limits  CBC - Abnormal; Notable for the following components:   WBC 17.4 (*)    All other components within normal limits  URINALYSIS, ROUTINE W REFLEX MICROSCOPIC - Abnormal; Notable for the following components:   Color, Urine AMBER (*)    APPearance HAZY (*)    Protein, ur 30 (*)  Bacteria, UA MANY (*)    All other components within normal limits  URINE CULTURE  LIPASE, BLOOD     RADIOLOGY CT of the abdomen and pelvis pending at time of transfer of care   PROCEDURES:  Critical Care performed: No  Procedures    IMPRESSION / MDM / ASSESSMENT AND PLAN / ED COURSE  I reviewed the triage vital signs and the nursing notes.                              Differential diagnosis includes, but is not limited to, diverticulitis, appendicitis, less likely UTI/pyelonephritis.  Patient's presentation is most consistent with acute presentation with potential threat to life or bodily function.  Labs/studies ordered: CMP, lipase, CBC, urinalysis, urine culture, CT of the abdomen and pelvis with IV contrast.  Interventions/Medications given:  Medications  iohexol (OMNIPAQUE) 300 MG/ML solution 100 mL (100 mLs Intravenous Contrast Given 08/24/23 2007)  morphine (PF) 4 MG/ML injection 4 mg (4 mg Intramuscular Given 08/24/23 1839)   ondansetron (ZOFRAN-ODT) disintegrating tablet 4 mg (4 mg Oral Given 08/24/23 1839)  piperacillin-tazobactam (ZOSYN) IVPB 3.375 g (0 g Intravenous Stopped 08/24/23 2016)    (Note:  hospital course my include additional interventions and/or labs/studies not listed above.)   Strongly suspect diverticulitis, likely perforated, although appendicitis is also possible.  We will place an IV and obtain a CT scan.  Her labs are essentially normal and reassuring except for very slight elevation of her AST but she has a leukocytosis of 17.4, further suggesting the possibility of infection.  Given the degree of localized peritonitis and her leukocytosis, I will treat empirically with Zosyn 3.375 g IV to begin antibiosis.  Awaiting imaging for more definitive results.     Clinical Course as of 08/24/23 2101  Sun Aug 24, 2023  1836 Difficulty obtaining IV access.  In the meantime I changed the order to morphine 4 mg intramuscular and Zofran 4 mg ODT and IV team is being consulted. [CF]  1930 Transferring ED care to [place], [place].   The plan is to follow-up on the CT of the abdomen and pelvis.  Assuming the patient has diverticulitis, this likely can be treated with outpatient antibiotics.  Should she have a surgical issue such as appendicitis or perforated diverticulitis, Ms. Joseph Art will consult with the appropriate specialist. [CF]    Clinical Course User Index [CF] Loleta Rose, MD     FINAL CLINICAL IMPRESSION(S) / ED DIAGNOSES   Final diagnoses:  Lower abdominal pain     Rx / DC Orders   ED Discharge Orders     None        Note:  This document was prepared using Dragon voice recognition software and may include unintentional dictation errors.   Loleta Rose, MD 08/24/23 2101

## 2023-08-24 NOTE — Discharge Instructions (Signed)
Take Cipro sent twice daily for 5 days. Take Flagyl 3 times daily for the next 5 days. Strive [place] for a soft diet over the next week. You can take Percocet with stool softener for pain. If pain medication causes you to have nausea, please take Zofran with it.

## 2023-08-24 NOTE — ED Notes (Signed)
See triage notes. Patient c/o LLQ abdominal pain that radiates to her back that began on Friday.

## 2023-08-24 NOTE — ED Notes (Signed)
Pt back from CT

## 2023-08-24 NOTE — ED Provider Notes (Signed)
Assumed patient care from Tracey Parish, MD.  I reassessed patient and she was resting comfortably.  CT abdomen pelvis indicates acute uncomplicated diverticulitis.  We discussed possible admission but patient states that she feels comfortable managing her symptoms at home.  Patient was started on ciprofloxacin and Flagyl.  She was prescribed a short course of Percocet for pain.  Return precautions were given to return to the emergency department if her pain worsens at home.  She voiced understanding and feels comfortable with this plan.   Pia Mau Four[place], Cordelia Poche 08/24/23 2116    Loleta Rose, MD 08/24/23 2120

## 2023-08-24 NOTE — ED Notes (Signed)
Pt to CT

## 2023-08-26 LAB — URINE CULTURE

## 2023-09-09 ENCOUNTER — Ambulatory Visit: Payer: BC Managed Care – PPO | Admitting: Dermatology

## 2023-09-09 DIAGNOSIS — D1801 Hemangioma of skin and subcutaneous tissue: Secondary | ICD-10-CM

## 2023-09-09 DIAGNOSIS — W908XXA Exposure to other nonionizing radiation, initial encounter: Secondary | ICD-10-CM | POA: Diagnosis not present

## 2023-09-09 DIAGNOSIS — L821 Other seborrheic keratosis: Secondary | ICD-10-CM

## 2023-09-09 DIAGNOSIS — L578 Other skin changes due to chronic exposure to nonionizing radiation: Secondary | ICD-10-CM

## 2023-09-09 DIAGNOSIS — D3610 Benign neoplasm of peripheral nerves and autonomic nervous system, unspecified: Secondary | ICD-10-CM

## 2023-09-09 DIAGNOSIS — I878 Other specified disorders of veins: Secondary | ICD-10-CM

## 2023-09-09 DIAGNOSIS — D3614 Benign neoplasm of peripheral nerves and autonomic nervous system of thorax: Secondary | ICD-10-CM

## 2023-09-09 DIAGNOSIS — D229 Melanocytic nevi, unspecified: Secondary | ICD-10-CM

## 2023-09-09 DIAGNOSIS — L814 Other melanin hyperpigmentation: Secondary | ICD-10-CM

## 2023-09-09 DIAGNOSIS — Z1283 Encounter for screening for malignant neoplasm of skin: Secondary | ICD-10-CM

## 2023-09-09 DIAGNOSIS — R238 Other skin changes: Secondary | ICD-10-CM

## 2023-09-09 DIAGNOSIS — L853 Xerosis cutis: Secondary | ICD-10-CM

## 2023-09-09 NOTE — Progress Notes (Unsigned)
   Follow-Up Visit   Subjective  Tracey Fischer is a 62 y.o. female who presents for the following: Skin Cancer Screening and Full Body Skin Exam  The patient presents for Total-Body Skin Exam (TBSE) for skin cancer screening and mole check. The patient has spots, moles and lesions to be evaluated, some may be new or changing. No history of skin cancer.     The following portions of the chart were reviewed this encounter and updated as appropriate: medications, allergies, medical history  Review of Systems:  No other skin or systemic complaints except as noted in HPI or Assessment and Plan.  Objective  Well appearing patient in no apparent distress; mood and affect are within normal limits.  A full examination was performed including scalp, head, eyes, ears, nose, lips, neck, chest, axillae, abdomen, back, buttocks, bilateral upper extremities, bilateral lower extremities, hands, feet, fingers, toes, fingernails, and toenails. All findings within normal limits unless otherwise noted below.   Relevant physical exam findings are noted in the Assessment and Plan.    Assessment & Plan   SKIN CANCER SCREENING PERFORMED TODAY.  ACTINIC DAMAGE - Chronic condition, secondary to cumulative UV/sun exposure - diffuse scaly erythematous macules with underlying dyspigmentation - Recommend daily broad spectrum sunscreen SPF 30+ to sun-exposed areas, reapply every 2 hours as needed.  - Staying in the shade or wearing long sleeves, sun glasses (UVA+UVB protection) and wide brim hats (4-inch brim around the entire circumference of the hat) are also recommended for sun protection.  - Call for new or changing lesions.  LENTIGINES, SEBORRHEIC KERATOSES, HEMANGIOMAS - Benign normal skin lesions - Benign-appearing - Call for any changes  MELANOCYTIC NEVI - Tan-brown and/or pink-flesh-colored symmetric macules and papules - Benign appearing on exam today - Observation - Call clinic for new or  changing moles - Recommend daily use of broad spectrum spf 30+ sunscreen to sun-exposed areas.   [place] Exam: small purple papule at central lower lip  Treatment Plan: Benign-appearing. Observe   Neurofibroma - right lateral chest Exam:   soft flesh papule   Benign-appearing.  Observation.  Call clinic for new or changing lesions.  Recommend daily use of broad spectrum spf 30+ sunscreen to sun-exposed areas.    Xerosis - diffuse xerotic patches - recommend gentle, hydrating skin care - gentle skin care handout given  Return in about 1 year (around 09/08/2024) for TBSE.  ICherlyn Labella, CMA, am acting as scribe for Willeen Niece, MD .   Documentation: I have reviewed the above documentation for accuracy and completeness, and I agree with the above.  Willeen Niece, MD

## 2023-09-09 NOTE — Patient Instructions (Addendum)
Gentle Skin Care Guide  1. Bathe no more than once a day.  2. Avoid bathing in hot water  3. Use a mild soap like Dove, Vanicream, Cetaphil, CeraVe. Can use Lever 2000 or Cetaphil antibacterial soap  4. Use soap only where you need it. On most days, use it under your arms, between your legs, and on your feet. Let the water rinse other areas unless visibly dirty.  5. When you get out of the bath/shower, use a towel to gently blot your skin dry, don't rub it.  6. While your skin is still a little damp, apply a moisturizing cream such as Vanicream, CeraVe, Cetaphil, Eucerin, Sarna lotion or plain Vaseline Jelly. For hands apply Neutrogena [place] Hand Cream or Excipial Hand Cream.  7. Reapply moisturizer any time you start to itch or feel dry.  8. Sometimes using free and clear laundry detergents can be helpful. Fabric softener sheets should be avoided. Downy Free & Gentle liquid, or any liquid fabric softener that is free of dyes and perfumes, it acceptable to use  9. If your doctor has given you prescription creams you may apply moisturizers over them      Melanoma ABCDEs  Melanoma is the most dangerous type of skin cancer, and is the leading cause of death from skin disease.  You are more likely to develop melanoma if you: Have light-colored skin, light-colored eyes, or red or blond hair Spend a lot of time in the sun Tan regularly, either outdoors or in a tanning bed Have had blistering sunburns, especially during childhood Have a close family member who has had a melanoma Have atypical moles or large birthmarks  Early detection of melanoma is key since treatment is typically straightforward and cure rates are extremely high if we catch it early.   The first sign of melanoma is often a change in a mole or a new dark spot.  The ABCDE system is a way of remembering the signs of melanoma.  A for asymmetry:  The two halves do not match. B for border:  The edges of the growth are  irregular. C for color:  A mixture of colors are present instead of an even brown color. D for diameter:  Melanomas are usually (but not always) greater than 6mm - the size of a pencil eraser. E for evolution:  The spot keeps changing in size, shape, and color.  Please check your skin once per month between visits. You can use a small mirror in front and a large mirror behind you to keep an eye on the back side or your body.   If you see any new or changing lesions before your next follow-up, please call to schedule a visit.  Please continue daily skin protection including broad spectrum sunscreen SPF 30+ to sun-exposed areas, reapplying every 2 hours as needed when you're outdoors.   Staying in the shade or wearing long sleeves, sun glasses (UVA+UVB protection) and wide brim hats (4-inch brim around the entire circumference of the hat) are also recommended for sun protection.    ______________________________________________________________   Due to recent changes in healthcare laws, you may see results of your pathology and/or laboratory studies on MyChart before the doctors have had a chance to review them. We understand that in some cases there may be results that are confusing or concerning to you. Please understand that not all results are received at the same time and often the doctors may need to interpret multiple results in order  to provide you with the best plan of care or course of treatment. Therefore, we ask that you please give [place] 2 business days to thoroughly review all your results before contacting the office for clarification. Should we see a critical lab result, you will be contacted sooner.   If You Need Anything After Your Visit  If you have any questions or concerns for your doctor, please call our main line at [phone] and press option 4 to reach your doctor's medical assistant. If no one answers, please leave a voicemail as directed and we will return your call as  soon as possible. Messages left after 4 pm will be answered the following business day.   You may also send [place] a message via MyChart. We typically respond to MyChart messages within 1-2 business days.  For prescription refills, please ask your pharmacy to contact our office. Our fax number is [phone].  If you have an urgent issue when the clinic is closed that cannot wait until the next business day, you can page your doctor at the number below.    Please note that while we do our best to be available for urgent issues outside of office hours, we are not available 24/7.   If you have an urgent issue and are unable to reach [place], you may choose to seek medical care at your doctor's office, retail clinic, urgent care center, or emergency room.  If you have a medical emergency, please immediately call 911 or go to the emergency department.  Pager Numbers  - Dr. Gwen Pounds: [phone]  - Dr. Roseanne Reno: [phone]  - Dr. Katrinka Blazing: [phone]   In the event of inclement weather, please call our main line at [phone] for an update on the status of any delays or closures.  Dermatology Medication Tips: Please keep the boxes that topical medications come in in order to help keep track of the instructions about where and how to use these. Pharmacies typically print the medication instructions only on the boxes and not directly on the medication tubes.   If your medication is too expensive, please contact our office at [phone] option 4 or send [place] a message through MyChart.   We are unable to tell what your co-pay for medications will be in advance as this is different depending on your insurance coverage. However, we may be able to find a substitute medication at lower cost or fill out paperwork to get insurance to cover a needed medication.   If a prior authorization is required to get your medication covered by your insurance company, please allow [place] 1-2 business days to complete this  process.  Drug prices often vary depending on where the prescription is filled and some pharmacies may offer cheaper prices.  The website www.goodrx.com contains coupons for medications through different pharmacies. The prices here do not account for what the cost may be with help from insurance (it may be cheaper with your insurance), but the website can give you the price if you did not use any insurance.  - You can print the associated coupon and take it with your prescription to the pharmacy.  - You may also stop by our office during regular business hours and pick up a GoodRx coupon card.  - If you need your prescription sent electronically to a different pharmacy, notify our office through [facility] or by phone at [phone] option 4.     Si Usted Necesita Algo Despus de Su Visita  Tambin puede enviarnos  un mensaje a travs de MyChart. Por lo general respondemos a los mensajes de MyChart en el transcurso de 1 a 2 das hbiles.  Para renovar recetas, por favor pida a su farmacia que se ponga en contacto con nuestra oficina. Annie Sable de fax es [place] [phone].  Si tiene un asunto urgente cuando la clnica est cerrada y que no puede esperar hasta el siguiente da hbil, puede llamar/localizar a su doctor(a) al nmero que aparece a continuacin.   Por favor, tenga en cuenta que aunque hacemos todo lo posible para estar disponibles para asuntos urgentes fuera del horario de [place], no estamos disponibles las 24 horas del da, los 7 [address]  [address] de la [place].   Si tiene un problema urgente y no puede comunicarse con nosotros, puede optar por buscar atencin mdica  en el consultorio de su doctor(a), en una clnica privada, en un centro de atencin urgente o en una sala de emergencias.  Si tiene Engineer, drilling, por favor llame inmediatamente al 911 o vaya a la sala de emergencias.  Nmeros de bper  - Dr. Gwen Pounds: [phone]  - Dra. Roseanne Reno: [phone]  - Dr.  Katrinka Blazing: [phone]   En caso de inclemencias del tiempo, por favor llame a Lacy Duverney principal al [phone] para una actualizacin sobre el Page de cualquier retraso o cierre.  Consejos para la medicacin en dermatologa: Por favor, guarde las cajas en las que vienen los medicamentos de uso tpico para ayudarle a seguir las instrucciones sobre dnde y cmo usarlos. Las farmacias generalmente imprimen las instrucciones del medicamento slo en las cajas y no directamente en los tubos del [place].   Si su medicamento es muy caro, por favor, pngase en contacto con Rolm Gala llamando al [phone] y presione la opcin 4 o envenos un mensaje a travs de Clinical cytogeneticist.   No podemos decirle cul ser su copago por los medicamentos por adelantado ya que esto es diferente dependiendo de la cobertura de su seguro. Sin embargo, es posible que podamos encontrar un medicamento sustituto a Audiological scientist un formulario para que el seguro cubra el medicamento que se considera necesario.   Si se requiere una autorizacin previa para que su compaa de seguros [place] su medicamento, por favor permtanos de 1 a 2 das hbiles para completar [address]  Los precios de los medicamentos varan con frecuencia dependiendo del Environmental consultant de dnde se surte la receta y alguna farmacias pueden ofrecer precios ms baratos.  El sitio web www.goodrx.com tiene cupones para medicamentos de Health and safety inspector. Los precios aqu no tienen en cuenta lo que podra costar con la ayuda del seguro (puede ser ms barato con su seguro), pero el sitio web puede darle el precio si no utiliz Tourist information centre manager.  - Puede imprimir el cupn correspondiente y llevarlo con su receta a la farmacia.  - Tambin puede pasar por nuestra oficina durante el horario de atencin regular y Education officer, museum una tarjeta de cupones de GoodRx.  - Si necesita que su receta se enve electrnicamente a una farmacia diferente, informe a nuestra oficina a  travs de MyChart de [place] o por telfono llamando al [phone] y presione la opcin 4.

## 2023-10-15 ENCOUNTER — Ambulatory Visit: Payer: BC Managed Care – PPO

## 2023-10-15 DIAGNOSIS — K635 Polyp of colon: Secondary | ICD-10-CM | POA: Diagnosis not present

## 2023-10-15 DIAGNOSIS — K5730 Diverticulosis of large intestine without perforation or abscess without bleeding: Secondary | ICD-10-CM | POA: Diagnosis not present

## 2023-10-15 DIAGNOSIS — Z1211 Encounter for screening for malignant neoplasm of colon: Secondary | ICD-10-CM | POA: Diagnosis present

## 2023-10-15 DIAGNOSIS — Z800 Family history of malignant neoplasm of digestive organs: Secondary | ICD-10-CM | POA: Diagnosis not present

## 2024-01-17 ENCOUNTER — Observation Stay: Payer: Self-pay | Admitting: Anesthesiology

## 2024-01-17 ENCOUNTER — Observation Stay
Admission: EM | Admit: 2024-01-17 | Discharge: 2024-01-19 | Disposition: A | Discharge status: Home / Self Care | Payer: Self-pay | Attending: Surgery | Admitting: Surgery

## 2024-01-17 ENCOUNTER — Emergency Department: Payer: Self-pay

## 2024-01-17 ENCOUNTER — Other Ambulatory Visit: Payer: Self-pay

## 2024-01-17 ENCOUNTER — Encounter
Admission: EM | Disposition: A | Discharge status: Home / Self Care | Payer: Self-pay | Source: Home / Self Care | Attending: Emergency Medicine

## 2024-01-17 DIAGNOSIS — I2510 Atherosclerotic heart disease of native coronary artery without angina pectoris: Secondary | ICD-10-CM | POA: Insufficient documentation

## 2024-01-17 DIAGNOSIS — K810 Acute cholecystitis: Principal | ICD-10-CM | POA: Diagnosis present

## 2024-01-17 DIAGNOSIS — Z79899 Other long term (current) drug therapy: Secondary | ICD-10-CM | POA: Insufficient documentation

## 2024-01-17 DIAGNOSIS — K8012 Calculus of gallbladder with acute and chronic cholecystitis without obstruction: Secondary | ICD-10-CM | POA: Diagnosis not present

## 2024-01-17 DIAGNOSIS — R1013 Epigastric pain: Secondary | ICD-10-CM | POA: Diagnosis present

## 2024-01-17 LAB — COMPREHENSIVE METABOLIC PANEL
ALT: 15 U/L (ref 0–44)
AST: 20 U/L (ref 15–41)
Albumin: 4 g/dL (ref 3.5–5.0)
Alkaline Phosphatase: 88 U/L (ref 38–126)
Anion gap: 8 (ref 5–15)
BUN: 13 mg/dL (ref 8–23)
CO2: 25 mmol/L (ref 22–32)
Calcium: 9.1 mg/dL (ref 8.9–10.3)
Chloride: 102 mmol/L (ref 98–111)
Creatinine, Ser: 0.58 mg/dL (ref 0.44–1.00)
GFR, Estimated: >60 mL/min (ref >60)
Glucose, Bld: 121 mg/dL — ABNORMAL HIGH (ref 70–99)
Potassium: 4 mmol/L (ref 3.5–5.1)
Sodium: 135 mmol/L (ref 135–145)
Total Bilirubin: 0.5 mg/dL (ref 0.0–1.2)
Total Protein: 8.4 g/dL — ABNORMAL HIGH (ref 6.5–8.1)

## 2024-01-17 LAB — CBC
HCT: 43.1 % (ref 36.0–46.0)
Hemoglobin: 14.2 g/dL (ref 12.0–15.0)
MCH: 30.5 pg (ref 26.0–34.0)
MCHC: 32.9 g/dL (ref 30.0–36.0)
MCV: 92.7 fL (ref 80.0–100.0)
Platelets: 337 10*3/uL (ref 150–400)
RBC: 4.65 MIL/uL (ref 3.87–5.11)
RDW: 12.6 % (ref 11.5–15.5)
WBC: 12.2 10*3/uL — ABNORMAL HIGH (ref 4.0–10.5)
nRBC: 0 % (ref 0.0–0.2)

## 2024-01-17 LAB — URINALYSIS, ROUTINE W REFLEX MICROSCOPIC
Bilirubin Urine: NEGATIVE
Glucose, UA: NEGATIVE mg/dL
Hgb urine dipstick: NEGATIVE
Ketones, ur: 5 mg/dL — AB
Leukocytes,Ua: NEGATIVE
Nitrite: NEGATIVE
Protein, ur: NEGATIVE mg/dL
Specific Gravity, Urine: 1.023 (ref 1.005–1.030)
pH: 6 (ref 5.0–8.0)

## 2024-01-17 LAB — HIV ANTIBODY (ROUTINE TESTING W REFLEX): HIV Screen 4th Generation wRfx: NONREACTIVE

## 2024-01-17 LAB — LIPASE, BLOOD: Lipase: 24 U/L (ref 11–51)

## 2024-01-17 SURGERY — CHOLECYSTECTOMY, ROBOT-ASSISTED, LAPAROSCOPIC
Anesthesia: General | Site: Abdomen

## 2024-01-17 MED ORDER — FENTANYL CITRATE (PF) 100 MCG/2ML IJ SOLN
INTRAMUSCULAR | Status: DC | PRN
  Administered 2024-01-17 (×4): 50 ug via INTRAVENOUS

## 2024-01-17 MED ORDER — FENTANYL CITRATE (PF) 100 MCG/2ML IJ SOLN
INTRAMUSCULAR | Status: AC
  Filled 2024-01-17: qty 2

## 2024-01-17 MED ORDER — INDOCYANINE GREEN 25 MG IV SOLR
1.2500 mg | Freq: Once | INTRAVENOUS | Status: AC
  Administered 2024-01-17: 1.25 mg via INTRAVENOUS

## 2024-01-17 MED ORDER — LIDOCAINE-EPINEPHRINE (PF) 1 %-1:200000 IJ SOLN
INTRAMUSCULAR | Status: DC | PRN
  Administered 2024-01-17: 20 mL via INTRAMUSCULAR

## 2024-01-17 MED ORDER — BUPIVACAINE HCL (PF) 0.5 % IJ SOLN
INTRAMUSCULAR | Status: AC
Start: 2024-01-17 — End: ?
  Filled 2024-01-17: qty 30

## 2024-01-17 MED ORDER — TRAMADOL HCL 50 MG PO TABS: 50.0000 mg | ORAL_TABLET | Freq: Four times a day (QID) | ORAL | Status: DC | PRN

## 2024-01-17 MED ORDER — PHENYLEPHRINE 80 MCG/ML (10ML) SYRINGE FOR IV PUSH (FOR BLOOD PRESSURE SUPPORT)
PREFILLED_SYRINGE | INTRAVENOUS | Status: AC
  Filled 2024-01-17: qty 10

## 2024-01-17 MED ORDER — MORPHINE SULFATE (PF) 4 MG/ML IV SOLN
4.0000 mg | Freq: Once | INTRAVENOUS | Status: AC
  Administered 2024-01-17: 4 mg via INTRAMUSCULAR

## 2024-01-17 MED ORDER — SUGAMMADEX SODIUM 200 MG/2ML IV SOLN
INTRAVENOUS | Status: DC | PRN
  Administered 2024-01-17: 200 mg via INTRAVENOUS

## 2024-01-17 MED ORDER — ONDANSETRON HCL 4 MG/2ML IJ SOLN
INTRAMUSCULAR | Status: AC
  Filled 2024-01-17: qty 2

## 2024-01-17 MED ORDER — ISOSORBIDE MONONITRATE ER 30 MG PO TB24
30.0000 mg | ORAL_TABLET | Freq: Every day | ORAL | Status: DC
  Administered 2024-01-18 – 2024-01-19 (×2): 30 mg via ORAL
  Filled 2024-01-17 (×2): qty 1

## 2024-01-17 MED ORDER — SUGAMMADEX SODIUM 200 MG/2ML IV SOLN
INTRAVENOUS | Status: AC
  Filled 2024-01-17: qty 2

## 2024-01-17 MED ORDER — ROCURONIUM BROMIDE 100 MG/10ML IV SOLN
INTRAVENOUS | Status: DC | PRN
  Administered 2024-01-17: 10 mg via INTRAVENOUS
  Administered 2024-01-17: 30 mg via INTRAVENOUS

## 2024-01-17 MED ORDER — OXYCODONE HCL 5 MG PO TABS
ORAL_TABLET | ORAL | Status: AC
  Filled 2024-01-17: qty 1

## 2024-01-17 MED ORDER — CEFAZOLIN SODIUM-DEXTROSE 2-4 GM/100ML-% IV SOLN
2.0000 g | Freq: Once | INTRAVENOUS | Status: AC
  Administered 2024-01-17: 2 g via INTRAVENOUS

## 2024-01-17 MED ORDER — PHENYLEPHRINE 80 MCG/ML (10ML) SYRINGE FOR IV PUSH (FOR BLOOD PRESSURE SUPPORT)
PREFILLED_SYRINGE | INTRAVENOUS | Status: DC | PRN
  Administered 2024-01-17: 160 ug via INTRAVENOUS

## 2024-01-17 MED ORDER — ONDANSETRON HCL 4 MG/2ML IJ SOLN: 4.0000 mg | Freq: Once | INTRAMUSCULAR | Status: DC | PRN

## 2024-01-17 MED ORDER — DEXMEDETOMIDINE HCL IN NACL 80 MCG/20ML IV SOLN
INTRAVENOUS | Status: DC | PRN
Start: 2024-01-17 — End: 2024-01-17
  Administered 2024-01-17: 8 ug via INTRAVENOUS

## 2024-01-17 MED ORDER — SUCCINYLCHOLINE CHLORIDE 200 MG/10ML IV SOSY
PREFILLED_SYRINGE | INTRAVENOUS | Status: AC
  Filled 2024-01-17: qty 10

## 2024-01-17 MED ORDER — MORPHINE SULFATE (PF) 2 MG/ML IV SOLN
2.0000 mg | INTRAVENOUS | Status: DC | PRN
  Administered 2024-01-17: 2 mg via INTRAVENOUS
  Filled 2024-01-17: qty 1

## 2024-01-17 MED ORDER — ONDANSETRON HCL 4 MG/2ML IJ SOLN
INTRAMUSCULAR | Status: DC | PRN
  Administered 2024-01-17: 4 mg via INTRAVENOUS

## 2024-01-17 MED ORDER — 0.9 % SODIUM CHLORIDE (POUR BTL) OPTIME
TOPICAL | Status: DC | PRN
  Administered 2024-01-17: 500 mL

## 2024-01-17 MED ORDER — LOSARTAN POTASSIUM 50 MG PO TABS
50.0000 mg | ORAL_TABLET | Freq: Two times a day (BID) | ORAL | Status: DC
  Administered 2024-01-18 – 2024-01-19 (×3): 50 mg via ORAL
  Filled 2024-01-17 (×3): qty 1

## 2024-01-17 MED ORDER — MIDAZOLAM HCL 2 MG/2ML IJ SOLN
INTRAMUSCULAR | Status: DC | PRN
  Administered 2024-01-17: 2 mg via INTRAVENOUS

## 2024-01-17 MED ORDER — LIDOCAINE HCL (PF) 2 % IJ SOLN
INTRAMUSCULAR | Status: AC
  Filled 2024-01-17: qty 5

## 2024-01-17 MED ORDER — DOCUSATE SODIUM 100 MG PO CAPS: 100.0000 mg | ORAL_CAPSULE | Freq: Two times a day (BID) | ORAL | Status: DC | PRN

## 2024-01-17 MED ORDER — ONDANSETRON HCL 4 MG/2ML IJ SOLN
4.0000 mg | Freq: Once | INTRAMUSCULAR | Status: DC
  Filled 2024-01-17: qty 2

## 2024-01-17 MED ORDER — OXYCODONE HCL 5 MG PO TABS
5.0000 mg | ORAL_TABLET | Freq: Once | ORAL | Status: AC | PRN
  Administered 2024-01-17: 5 mg via ORAL

## 2024-01-17 MED ORDER — MORPHINE SULFATE (PF) 4 MG/ML IV SOLN
4.0000 mg | Freq: Once | INTRAVENOUS | Status: DC
  Filled 2024-01-17: qty 1

## 2024-01-17 MED ORDER — DEXAMETHASONE SODIUM PHOSPHATE 10 MG/ML IJ SOLN
INTRAMUSCULAR | Status: AC
  Filled 2024-01-17: qty 1

## 2024-01-17 MED ORDER — DEXAMETHASONE SODIUM PHOSPHATE 10 MG/ML IJ SOLN
INTRAMUSCULAR | Status: DC | PRN
  Administered 2024-01-17: 10 mg via INTRAVENOUS

## 2024-01-17 MED ORDER — FENTANYL CITRATE (PF) 100 MCG/2ML IJ SOLN
25.0000 ug | INTRAMUSCULAR | Status: DC | PRN
  Administered 2024-01-17: 50 ug via INTRAVENOUS

## 2024-01-17 MED ORDER — ENOXAPARIN SODIUM 40 MG/0.4ML IJ SOSY
40.0000 mg | PREFILLED_SYRINGE | INTRAMUSCULAR | Status: DC
  Administered 2024-01-17 – 2024-01-18 (×2): 40 mg via SUBCUTANEOUS
  Filled 2024-01-17 (×2): qty 0.4

## 2024-01-17 MED ORDER — MIDAZOLAM HCL 2 MG/2ML IJ SOLN
INTRAMUSCULAR | Status: AC
  Filled 2024-01-17: qty 2

## 2024-01-17 MED ORDER — LIDOCAINE HCL (CARDIAC) PF 100 MG/5ML IV SOSY
PREFILLED_SYRINGE | INTRAVENOUS | Status: DC | PRN
  Administered 2024-01-17: 100 mg via INTRAVENOUS

## 2024-01-17 MED ORDER — FIBRIN SEALANT 2 ML SINGLE DOSE KIT
PACK | CUTANEOUS | Status: AC
  Filled 2024-01-17: qty 2

## 2024-01-17 MED ORDER — LACTATED RINGERS IV SOLN: INTRAVENOUS | Status: DC | PRN

## 2024-01-17 MED ORDER — ROCURONIUM BROMIDE 10 MG/ML (PF) SYRINGE
PREFILLED_SYRINGE | INTRAVENOUS | Status: AC
  Filled 2024-01-17: qty 10

## 2024-01-17 MED ORDER — HYDROCODONE-ACETAMINOPHEN 5-325 MG PO TABS
1.0000 | ORAL_TABLET | ORAL | Status: DC | PRN
  Administered 2024-01-18: 2 via ORAL
  Administered 2024-01-18: 1 via ORAL
  Administered 2024-01-18: 2 via ORAL
  Administered 2024-01-18 – 2024-01-19 (×2): 1 via ORAL
  Filled 2024-01-17: qty 1
  Filled 2024-01-17 (×3): qty 2
  Filled 2024-01-17: qty 1

## 2024-01-17 MED ORDER — CEFAZOLIN SODIUM-DEXTROSE 2-4 GM/100ML-% IV SOLN
INTRAVENOUS | Status: AC
  Filled 2024-01-17: qty 100

## 2024-01-17 MED ORDER — ACETAMINOPHEN 10 MG/ML IV SOLN
INTRAVENOUS | Status: AC
  Filled 2024-01-17: qty 100

## 2024-01-17 MED ORDER — SODIUM CHLORIDE 0.9 % IV BOLUS
1000.0000 mL | Freq: Once | INTRAVENOUS | Status: AC
  Administered 2024-01-17: 1000 mL via INTRAVENOUS

## 2024-01-17 MED ORDER — OXYCODONE HCL 5 MG/5ML PO SOLN: 5.0000 mg | Freq: Once | ORAL | Status: AC | PRN

## 2024-01-17 MED ORDER — LIDOCAINE-EPINEPHRINE 1 %-1:100000 IJ SOLN
INTRAMUSCULAR | Status: AC
  Filled 2024-01-17: qty 1

## 2024-01-17 MED ORDER — PROPOFOL 10 MG/ML IV BOLUS
INTRAVENOUS | Status: DC | PRN
  Administered 2024-01-17: 150 mg via INTRAVENOUS

## 2024-01-17 MED ORDER — ONDANSETRON 4 MG PO TBDP
4.0000 mg | ORAL_TABLET | Freq: Four times a day (QID) | ORAL | Status: DC | PRN
Start: 2024-01-17 — End: 2024-01-19
  Administered 2024-01-19: 4 mg via ORAL
  Filled 2024-01-17: qty 1

## 2024-01-17 MED ORDER — ACETAMINOPHEN 10 MG/ML IV SOLN
INTRAVENOUS | Status: DC | PRN
  Administered 2024-01-17: 1000 mg via INTRAVENOUS

## 2024-01-17 MED ORDER — ONDANSETRON HCL 4 MG/2ML IJ SOLN
4.0000 mg | Freq: Four times a day (QID) | INTRAMUSCULAR | Status: DC | PRN
  Filled 2024-01-17: qty 2

## 2024-01-17 MED ORDER — SUCCINYLCHOLINE CHLORIDE 200 MG/10ML IV SOSY
PREFILLED_SYRINGE | INTRAVENOUS | Status: DC | PRN
  Administered 2024-01-17: 120 mg via INTRAVENOUS

## 2024-01-17 MED ORDER — FIBRIN SEALANT 2 ML SINGLE DOSE KIT
PACK | CUTANEOUS | Status: DC | PRN
  Administered 2024-01-17: 2 mL via TOPICAL

## 2024-01-17 MED ORDER — INDOCYANINE GREEN 25 MG IV SOLR
INTRAVENOUS | Status: AC
  Filled 2024-01-17: qty 10

## 2024-01-17 SURGICAL SUPPLY — 46 items
ANCHOR TIS RET SYS 235ML (MISCELLANEOUS) ×2 IMPLANT
APPLICATOR VISTASEAL 35 (MISCELLANEOUS) IMPLANT
BAG PRESSURE INF REUSE 1000 (BAG) IMPLANT
CAUTERY HOOK MNPLR 1.6 DVNC XI (INSTRUMENTS) ×2 IMPLANT
CLIP LIGATING HEMO O LOK GREEN (MISCELLANEOUS) ×2 IMPLANT
DERMABOND ADVANCED .7 DNX12 (GAUZE/BANDAGES/DRESSINGS) ×2 IMPLANT
DRAIN CHANNEL JP 15F RND 3/16 (MISCELLANEOUS) IMPLANT
DRAPE ARM DVNC X/XI (DISPOSABLE) ×8 IMPLANT
DRAPE COLUMN DVNC XI (DISPOSABLE) ×2 IMPLANT
DRSG TEGADERM 4X4.75 (GAUZE/BANDAGES/DRESSINGS) IMPLANT
ELECT REM PT RETURN 9FT ADLT (ELECTROSURGICAL) ×2 IMPLANT
ELECTRODE REM PT RTRN 9FT ADLT (ELECTROSURGICAL) ×2 IMPLANT
EVACUATOR SILICONE 100CC (DRAIN) IMPLANT
FORCEPS BPLR FENES DVNC XI (FORCEP) ×2 IMPLANT
FORCEPS PROGRASP DVNC XI (FORCEP) ×2 IMPLANT
GLOVE BIOGEL PI IND STRL 7.0 (GLOVE) ×4 IMPLANT
GLOVE SURG SYN 6.5 ES PF (GLOVE) ×4 IMPLANT
GLOVE SURG SYN 6.5 PF PI (GLOVE) ×4 IMPLANT
GOWN STRL REUS W/ TWL LRG LVL3 (GOWN DISPOSABLE) ×6 IMPLANT
IRRIGATOR SUCT 8 DISP DVNC XI (IRRIGATION / IRRIGATOR) IMPLANT
IV NS 1000ML BAXH (IV SOLUTION) IMPLANT
KIT TURNOVER KIT A (KITS) ×2 IMPLANT
LABEL OR SOLS (LABEL) ×2 IMPLANT
MANIFOLD NEPTUNE II (INSTRUMENTS) ×2 IMPLANT
NDL HYPO 22X1.5 SAFETY MO (MISCELLANEOUS) ×2 IMPLANT
NDL INSUFFLATION 14GA 120MM (NEEDLE) ×2 IMPLANT
NEEDLE HYPO 22X1.5 SAFETY MO (MISCELLANEOUS) ×2 IMPLANT
NEEDLE INSUFFLATION 14GA 120MM (NEEDLE) ×2 IMPLANT
NS IRRIG 500ML POUR BTL (IV SOLUTION) ×2 IMPLANT
OBTURATOR OPTICAL STND 8 DVNC (TROCAR) ×2 IMPLANT
OBTURATOR OPTICALSTD 8 DVNC (TROCAR) ×2 IMPLANT
PACK LAP CHOLECYSTECTOMY (MISCELLANEOUS) ×2 IMPLANT
SEAL UNIV 5-12 XI (MISCELLANEOUS) ×8 IMPLANT
SET TUBE SMOKE EVAC HIGH FLOW (TUBING) ×2 IMPLANT
SLEEVE SCD COMPRESS KNEE MED (STOCKING) IMPLANT
SOL ELECTROSURG ANTI STICK (MISCELLANEOUS) ×2 IMPLANT
SOLUTION ELECTROSURG ANTI STCK (MISCELLANEOUS) ×2 IMPLANT
SPONGE DRAIN TRACH 4X4 STRL 2S (GAUZE/BANDAGES/DRESSINGS) IMPLANT
SUT ETHILON 3-0 FS-10 30 BLK (SUTURE) ×2 IMPLANT
SUT MNCRL 4-0 27 PS-2 XMFL (SUTURE) ×4 IMPLANT
SUT VICRYL 0 UR6 27IN ABS (SUTURE) ×2 IMPLANT
SUTURE EHLN 3-0 FS-10 30 BLK (SUTURE) IMPLANT
SUTURE MNCRL 4-0 27XMF (SUTURE) ×4 IMPLANT
SYR 30ML LL (SYRINGE) IMPLANT
SYSTEM WECK SHIELD CLOSURE (TROCAR) IMPLANT
WATER STERILE IRR 500ML POUR (IV SOLUTION) ×2 IMPLANT

## 2024-01-17 NOTE — Anesthesia Postprocedure Evaluation (Signed)
 Anesthesia Post Note  Patient: Tracey Fischer  Procedure(s) Performed: CHOLECYSTECTOMY, ROBOT-ASSISTED, LAPAROSCOPIC INDOCYANINE GREEN FLUORESCENCE IMAGING (ICG) (Abdomen)  Patient location during evaluation: PACU Anesthesia Type: General Level of consciousness: awake and alert Pain management: pain level controlled Vital Signs Assessment: post-procedure vital signs reviewed and stable Respiratory status: spontaneous breathing, nonlabored ventilation, respiratory function stable and patient connected to nasal cannula oxygen Cardiovascular status: blood pressure returned to baseline and stable Postop Assessment: no apparent nausea or vomiting Anesthetic complications: no   No notable events documented.   Last Vitals:  Vitals:   01/17/24 2114 01/17/24 2115  BP: (!) 159/62 (!) 159/62  Pulse: 62 64  Resp: 10 (!) 9  Temp:    SpO2: 97% 97%    Last Pain:  Vitals:   01/17/24 2114  TempSrc:   PainSc: Asleep                 Corinda Gubler

## 2024-01-17 NOTE — Anesthesia Procedure Notes (Signed)
 Procedure Name: Intubation Date/Time: 01/17/2024 6:25 PM  Performed by: Irving Burton, CRNAPre-anesthesia Checklist: Patient identified, Patient being monitored, Timeout performed, Emergency Drugs available and Suction available Patient Re-evaluated:Patient Re-evaluated prior to induction Oxygen Delivery Method: Circle system utilized Preoxygenation: Pre-oxygenation with 100% oxygen Induction Type: IV induction and Rapid sequence Laryngoscope Size: 3 and McGrath Grade View: Grade II Tube type: Oral Tube size: 6.5 mm Number of attempts: 1 Airway Equipment and Method: Stylet Placement Confirmation: ETT inserted through vocal cords under direct vision, positive ETCO2 and breath sounds checked- equal and bilateral Secured at: 21 cm Tube secured with: Tape Dental Injury: Teeth and Oropharynx as per pre-operative assessment  Comments: Grade 2b view

## 2024-01-17 NOTE — ED Notes (Signed)
 First nurse note:  Brought by [facility] nurse for gallbladder workup. [facility] nurse unsure but pt may have been nauseous and vomiting for last 2 days or so. VS were 176/85, HR 76, 99% RA, 97.9.

## 2024-01-17 NOTE — ED Notes (Signed)
 IV access attempted twice without success. IV team consult put in.

## 2024-01-17 NOTE — ED Notes (Signed)
 The pt is a 46 yof with a c/c of upper mid abdominal pain since last night at approx. 10 pm. The pt advised she has also been vomiting. The pt has a previous hx of gallstones and diverticulitis issues. The pt is warm, pink, and dry sitting upright in the bed. The pt is alert and oriented x 4.

## 2024-01-17 NOTE — ED Triage Notes (Signed)
 Pt c/o upper abd pain that radiates to the back; started at 10pm last night. Also has some nausea & vomiting.

## 2024-01-17 NOTE — Transfer of Care (Signed)
 Immediate Anesthesia Transfer of Care Note  Patient: Tracey Fischer  Procedure(s) Performed: CHOLECYSTECTOMY, ROBOT-ASSISTED, LAPAROSCOPIC INDOCYANINE GREEN FLUORESCENCE IMAGING (ICG) (Abdomen)  Patient Location: PACU  Anesthesia Type:General  Level of Consciousness: sedated  Airway & Oxygen Therapy: Patient Spontanous Breathing and Patient connected to face mask oxygen  Post-op Assessment: Report given to RN and Post -op Vital signs reviewed and stable  Post vital signs: Reviewed and stable  Last Vitals:  Vitals Value Taken Time  BP 147/70 01/17/24 2004  Temp    Pulse 86 01/17/24 2005  Resp 24 01/17/24 2005  SpO2 100 % 01/17/24 2005  Vitals shown include unfiled device data.  Last Pain:  Vitals:   01/17/24 1709  TempSrc: Oral  PainSc:          Complications: No notable events documented.

## 2024-01-17 NOTE — ED Provider Notes (Signed)
 [facility] Provider Note    Event Date/Time   First MD Initiated Contact with Patient 01/17/24 1223     (approximate)   History   Abdominal Pain   HPI  Tracey Fischer is a 63 y.o. female who presents to the emergency department today because of concerns for abdominal pain.  The patient states the pain started around 10:00 last night.  Located in the epigastric it does radiate to her right upper back.  Pain has been fairly consistent since then.  Has been accompanied by nausea and nonbloody vomiting.  She has not noticed any recent change in her stool.  She does state that she had a gallbladder attack a number of years ago and that surgery was recommended at that time, but she had felt better so did not have the surgery and has not followed up with surgery. Per chart review she had an US done 18 years ago which was concerning for possible acalculous cholecystitis.      Physical Exam   Triage Vital Signs: ED Triage Vitals  Encounter Vitals Group     BP 01/17/24 1051 (!) 158/78     Systolic BP Percentile --      Diastolic BP Percentile --      Pulse Rate 01/17/24 1051 66     Resp 01/17/24 1051 16     Temp 01/17/24 1051 98.2 F (36.8 C)     Temp Source 01/17/24 1051 Oral     SpO2 01/17/24 1051 97 %     Weight 01/17/24 1051 204 lb (92.5 kg)     Height 01/17/24 1051 5\' 3"  (1.6 m)     Head Circumference --      Peak Flow --      Pain Score 01/17/24 1059 7     Pain Loc --      Pain Education --      Exclude from Growth Chart --     Most recent vital signs: Vitals:   01/17/24 1051  BP: (!) 158/78  Pulse: 66  Resp: 16  Temp: 98.2 F (36.8 C)  SpO2: 97%   General: Awake, alert, oriented. CV:  Good peripheral perfusion. Regular rate and rhythm. Resp:  Normal effort. Lungs clear. Abd:  No distention. Tender to palpation in the epigastric and RUQ.   ED Results / Procedures / Treatments   Labs (all labs ordered are listed, but only abnormal  results are displayed) Labs Reviewed  COMPREHENSIVE METABOLIC PANEL - Abnormal; Notable for the following components:      Result Value   Glucose, Bld 121 (*)    Total Protein 8.4 (*)    All other components within normal limits  CBC - Abnormal; Notable for the following components:   WBC 12.2 (*)    All other components within normal limits  LIPASE, BLOOD  URINALYSIS, ROUTINE W REFLEX MICROSCOPIC     EKG  None   RADIOLOGY I independently interpreted and visualized the RUQ [place]. My interpretation: Acute cholecystitis Radiology interpretation:  IMPRESSION:  Cholelithiasis with gallbladder wall thickening and positive  sonographic Murphy sign. Findings are concerning for acute  cholecystitis.     PROCEDURES:  Critical Care performed: No   MEDICATIONS ORDERED IN ED: Medications - No data to display   IMPRESSION / MDM / ASSESSMENT AND PLAN / ED COURSE  I reviewed the triage vital signs and the nursing notes.  Differential diagnosis includes, but is not limited to, gallbladder disease, pancreatitis, gastritis  Patient's presentation is most consistent with acute presentation with potential threat to life or bodily function.  Patient presented to the emergency department today because of concerns for epigastric and right upper quadrant abdominal pain.  On exam patient is quite tender to those areas.  Blood work shows slight leukocytosis.  Will get ultrasound to further evaluate.  [place] is consistent with acute cholecystitis. Discussed with the patient. Discussed with Dr. Tonna Boehringer with surgery who will evaluate the patient.     FINAL CLINICAL IMPRESSION(S) / ED DIAGNOSES   Final diagnoses:  Acute cholecystitis      Note:  This document was prepared using Dragon voice recognition software and may include unintentional dictation errors.    Phineas Semen, MD 01/17/24 [phone]

## 2024-01-17 NOTE — Plan of Care (Signed)

## 2024-01-17 NOTE — Anesthesia Preprocedure Evaluation (Signed)
 Anesthesia Evaluation  Patient identified by MRN, date of birth, ID band Patient awake    Reviewed: Allergy & Precautions, NPO status , Patient's Chart, lab work & pertinent test results  History of Anesthesia Complications Negative for: history of anesthetic complications  Airway Mallampati: II  TM Distance: >3 FB Neck ROM: Full    Dental no notable dental hx. (+) Teeth Intact   Pulmonary neg pulmonary ROS, neg sleep apnea, neg COPD, Patient abstained from smoking.Not current smoker   Pulmonary exam normal breath sounds clear to auscultation       Cardiovascular Exercise Tolerance: Good METShypertension, (-) CAD and (-) Past MI (-) dysrhythmias  Rhythm:Regular Rate:Normal - Systolic murmurs    Neuro/Psych negative neurological ROS  negative psych ROS   GI/Hepatic ,neg GERD  ,,(+)     (-) substance abuse   Cholecystitis + n/v   Endo/Other  neg diabetes    Renal/GU negative Renal ROS     Musculoskeletal   Abdominal  (+) + obese Abdomen: tender.   Peds  Hematology   Anesthesia Other Findings Past Medical History: No date: Coronary artery disease  Reproductive/Obstetrics                              Anesthesia Physical Anesthesia Plan  ASA: 2  Anesthesia Plan: General   Post-op Pain Management: Ofirmev IV (intra-op)* and Toradol IV (intra-op)*   Induction: Intravenous and Rapid sequence  PONV Risk Score and Plan: 4 or greater and Ondansetron, Dexamethasone and Midazolam  Airway Management Planned: Oral ETT and Video Laryngoscope Planned  Additional Equipment: None  Intra-op Plan:   Post-operative Plan: Extubation in OR  Informed Consent: I have reviewed the patients History and Physical, chart, labs and discussed the procedure including the risks, benefits and alternatives for the proposed anesthesia with the patient or authorized representative who has indicated his/her  understanding and acceptance.     Dental advisory given  Plan Discussed with: CRNA and Surgeon  Anesthesia Plan Comments: (Discussed risks of anesthesia with patient, including PONV, sore throat, lip/dental/eye damage. Rare risks discussed as well, such as cardiorespiratory and neurological sequelae, and allergic reactions. Discussed the role of CRNA in patient's perioperative care. Patient understands.)         Anesthesia Quick Evaluation

## 2024-01-17 NOTE — H&P (Signed)
 Subjective:   CC: Acute cholecystitis  HPI:  Tracey Fischer is a 63 y.o. female who is consulted by Derrill Kay for evaluation of above cc.  Symptoms were first noted 1 day ago. Pain is sharp, constant, epigastric.  Associated with nausea vomiting, exacerbated by nothing     Past Medical History:  has a past medical history of Coronary artery disease.  Past Surgical History:  has a past surgical history that includes Shoulder surgery (Left); Cesarean section; and Skin biopsy (2008).  Family History: family history is not on file.  Social History:  reports that she has never smoked. She has never used smokeless tobacco. She reports that she does not use drugs. No history on file for alcohol use.  Current Medications:  Prior to Admission medications   Medication Sig Start Date End Date Taking? Authorizing Provider  isosorbide mononitrate (IMDUR) 30 MG 24 hr tablet Take 30 mg by mouth daily. 04/02/21   [provider]  losartan (COZAAR) 50 MG tablet Take 50 mg by mouth 2 (two) times daily. 04/07/21   [provider]  vitamin B-12 (CYANOCOBALAMIN) 1000 MCG tablet Take 1,000 mcg by mouth daily.    [provider]    Allergies:  Allergies as of 01/17/2024 - Review Complete 01/17/2024  Allergen Reaction Noted   Hydralazine hcl Other (See Comments) 09/01/2018    ROS:  General: Denies weight loss, weight gain, fatigue, fevers, chills, and night sweats. Eyes: Denies blurry vision, double vision, eye pain, itchy eyes, and tearing. Ears: Denies hearing loss, earache, and ringing in ears. Nose: Denies sinus pain, congestion, infections, runny nose, and nosebleeds. Mouth/throat: Denies hoarseness, sore throat, bleeding gums, and difficulty swallowing. Heart: Denies chest pain, palpitations, racing heart, irregular heartbeat, leg pain or swelling, and decreased activity tolerance. Respiratory: Denies breathing difficulty, shortness of breath, wheezing, cough, and  sputum. GI: Denies change in appetite, heartburn,  constipation, diarrhea, and blood in stool. GU: Denies difficulty urinating, pain with urinating, urgency, frequency, blood in urine. Musculoskeletal: Denies joint stiffness, pain, swelling, muscle weakness. Skin: Denies rash, itching, mass, tumors, sores, and boils Neurologic: Denies headache, fainting, dizziness, seizures, numbness, and tingling. Psychiatric: Denies depression, anxiety, difficulty sleeping, and memory loss. Endocrine: Denies heat or cold intolerance, and increased thirst or urination. Blood/lymph: Denies easy bruising, and swollen glands     Objective:     BP (!) 135/59 (BP Location: Right Arm)   Pulse 68   Temp 98.3 F (36.8 C) (Oral)   Resp 16   Ht 5\' 3"  (1.6 m)   Wt 92.5 kg   SpO2 99%   BMI 36.14 kg/m    Constitutional :  alert, cooperative, appears stated age, and no distress  Lymphatics/Throat:  no asymmetry, masses, or scars  Respiratory:  clear to auscultation bilaterally  Cardiovascular:  regular rate and rhythm  Gastrointestinal: Soft, no guarding, tenderness to palpation epigastric and right upper quadrant .   Musculoskeletal: Steady movement  Skin: Cool and moist  Psychiatric: Normal affect, non-agitated, not confused       LABS:     Latest Ref Rng & Units 01/17/2024   11:22 AM 08/24/2023    3:08 PM 11/16/2016    7:23 PM  CMP  Glucose 70 - 99 mg/dL 956  213  086   BUN 8 - 23 mg/dL 13  9  12    Creatinine 0.44 - 1.00 mg/dL 5.78  4.69  6.29   Sodium 135 - 145 mmol/L 135  135  139  Potassium 3.5 - 5.1 mmol/L 4.0  4.2  3.5   Chloride 98 - 111 mmol/L 102  100  105   CO2 22 - 32 mmol/L 25  25  27    Calcium 8.9 - 10.3 mg/dL 9.1  8.8  8.8   Total Protein 6.5 - 8.1 g/dL 8.4  8.3    Total Bilirubin 0.0 - 1.2 mg/dL 0.5  1.3    Alkaline Phos 38 - 126 U/L 88  114    AST 15 - 41 U/L 20  44    ALT 0 - 44 U/L 15  34        Latest Ref Rng & Units 01/17/2024   11:22 AM 08/24/2023    3:08 PM 11/16/2016     7:23 PM  CBC  WBC 4.0 - 10.5 K/uL 12.2  17.4  9.4   Hemoglobin 12.0 - 15.0 g/dL 81.1  91.4  78.2   Hematocrit 36.0 - 46.0 % 43.1  43.2  39.8   Platelets 150 - 400 K/uL 337  330  255      RADS: CLINICAL DATA:  [id number] Epigastric pain [id number]   EXAM: ULTRASOUND ABDOMEN LIMITED RIGHT UPPER QUADRANT   COMPARISON:  August 24, 2023   FINDINGS: Gallbladder:   A positive sonographic Murphy sign noted by sonographer. Gallstone is noted. Gallbladder wall appears thickened and measures up to 8 mm. There is a favored tensile fundus sign on image 25 and 36.   Common bile duct:   Diameter: Visualized portion measures 4 mm, within normal limits.   Liver:   No focal lesion identified. Mildly heterogeneous in parenchymal echogenicity. Portal vein is patent on color Doppler imaging with normal direction of blood flow towards the liver.   Other: None.   IMPRESSION: Cholelithiasis with gallbladder wall thickening and positive sonographic Murphy sign. Findings are concerning for acute cholecystitis.     Electronically Signed   By: Meda Klinefelter M.D.   On: 01/17/2024 14:26   Assessment:      Acute cholecystitis  Plan:      Discussed the risk of surgery including post-op infxn, seroma, biloma, chronic pain, poor-delayed wound healing, retained gallstone, conversion to open procedure, post-op SBO or ileus, and need for additional procedures to address said risks.  The risks of general anesthetic including MI, CVA, sudden death or even reaction to anesthetic medications also discussed. Alternatives include continued observation.  Benefits include possible symptom relief, prevention of complications including acute cholecystitis, pancreatitis.  Typical post operative recovery of 3-5 days rest, continued pain in area and incision sites, possible loose stools up to 4-6 weeks, also discussed.  The patient understands the risks, any and all questions were answered to the  patient's satisfaction.  To OR for robotic assisted laparoscopic cholecystectomy  labs/images/medications/previous chart entries reviewed personally and relevant changes/updates noted above.

## 2024-01-18 LAB — BASIC METABOLIC PANEL
Anion gap: 7 (ref 5–15)
BUN: 12 mg/dL (ref 8–23)
CO2: 25 mmol/L (ref 22–32)
Calcium: 8.4 mg/dL — ABNORMAL LOW (ref 8.9–10.3)
Chloride: 104 mmol/L (ref 98–111)
Creatinine, Ser: 0.49 mg/dL (ref 0.44–1.00)
GFR, Estimated: >60 mL/min (ref >60)
Glucose, Bld: 145 mg/dL — ABNORMAL HIGH (ref 70–99)
Potassium: 4.4 mmol/L (ref 3.5–5.1)
Sodium: 136 mmol/L (ref 135–145)

## 2024-01-18 LAB — HEPATIC FUNCTION PANEL
ALT: 212 U/L — ABNORMAL HIGH (ref 0–44)
ALT: 210 U/L — ABNORMAL HIGH (ref 0–44)
AST: 266 U/L — ABNORMAL HIGH (ref 15–41)
AST: 233 U/L — ABNORMAL HIGH (ref 15–41)
Albumin: 3.3 g/dL — ABNORMAL LOW (ref 3.5–5.0)
Albumin: 3.4 g/dL — ABNORMAL LOW (ref 3.5–5.0)
Alkaline Phosphatase: 111 U/L (ref 38–126)
Alkaline Phosphatase: 111 U/L (ref 38–126)
Bilirubin, Direct: 0.9 mg/dL — ABNORMAL HIGH (ref 0.0–0.2)
Bilirubin, Direct: 0.6 mg/dL — ABNORMAL HIGH (ref 0.0–0.2)
Indirect Bilirubin: 0.8 mg/dL (ref 0.3–0.9)
Indirect Bilirubin: 1.1 mg/dL — ABNORMAL HIGH (ref 0.3–0.9)
Total Bilirubin: 1.7 mg/dL — ABNORMAL HIGH (ref 0.0–1.2)
Total Bilirubin: 1.7 mg/dL — ABNORMAL HIGH (ref 0.0–1.2)
Total Protein: 7.3 g/dL (ref 6.5–8.1)
Total Protein: 6.8 g/dL (ref 6.5–8.1)

## 2024-01-18 LAB — CBC
HCT: 40.4 % (ref 36.0–46.0)
Hemoglobin: 13.3 g/dL (ref 12.0–15.0)
MCH: 30.6 pg (ref 26.0–34.0)
MCHC: 32.9 g/dL (ref 30.0–36.0)
MCV: 92.9 fL (ref 80.0–100.0)
Platelets: 276 10*3/uL (ref 150–400)
RBC: 4.35 MIL/uL (ref 3.87–5.11)
RDW: 12.7 % (ref 11.5–15.5)
WBC: 8.7 10*3/uL (ref 4.0–10.5)
nRBC: 0 % (ref 0.0–0.2)

## 2024-01-18 NOTE — Op Note (Signed)
 Preoperative diagnosis:  acute and cholecystitis  Postoperative diagnosis: same as above  Procedure: Robotic assisted Laparoscopic Cholecystectomy.   Anesthesia: GETA   Surgeon: Sung Amabile  Specimen: Gallbladder  Complications: None  EBL: 15mL  Wound Classification: Clean Contaminated  Indications: see HPI  Findings: Critical view of safety noted Cystic duct and artery identified, ligated and divided, clips remained intact at end of procedure Adequate hemostasis  Description of procedure:  The patient was placed on the operating table in the supine position. SCDs placed, pre-op abx administered.  General anesthesia was induced and OG tube placed by anesthesia. A time-out was completed verifying correct patient, procedure, site, positioning, and implant(s) and/or special equipment prior to beginning this procedure. The abdomen was prepped and draped in the usual sterile fashion.    Veress needle was placed at the Palmer's point and insufflation was started after confirming a positive saline drop test and no immediate increase in abdominal pressure.  After reaching 15 mm, the Veress needle was removed and a 8 mm port was placed via optiview technique under umbilicus measured 20mm from gallbladder.  The abdomen was inspected and no abnormalities or injuries were found.  Under direct vision, ports were placed in the following locations: One 12 mm patient left of the umbilicus, 8cm from the periumbilical port, one 8 mm port placed to the patient right of the umbilical port 8 cm apart.  1 additional 8 mm port placed lateral to the 12mm port.  Once ports were placed, The table was placed in the reverse Trendelenburg position with the right side up. The Xi platform was brought into the operative field and docked to the ports successfully.  An endoscope was placed through the umbilical port, fenestrated grasper through the adjacent patient right port, prograsp to the far patient left port, and  then a hook cautery in the left port.  Hold made at top of gallbladder with hope to attempt drainage of some fluid of very distended gallbladder.  Thin bilious output suctioned out.  The dome of the gallbladder was grasped with prograsp, passed and retracted over the dome of the liver. Adhesions between the gallbladder and omentum, duodenum and transverse colon were lysed via hook cautery. The infundibulum was grasped with the fenestrated grasper and retracted toward the right lower quadrant. This maneuver exposed Calot's triangle. The very thickened peritoneum overlying the gallbladder infundibulum was then dissected  and the cystic duct and cystic artery identified.  Eventually, critical view of safety visualized with the liver bed clearly visible behind the duct and artery with no additional structures noted.  The cystic duct and cystic artery clipped and divided close to the gallbladder.  ICG was not able to adequately visualize the common bile duct due to the extremely thickened and inflamed tissue surrounding the area.    The gallbladder was then dissected from its peritoneal and liver bed attachments by electrocautery.  Extensive inflammation of the liver bed noted with some moderate bleeding once gallbladder was removed.  Vistaseal applied to the area to ensure hemostasis after extensive cauterization.  Hemostasis was checked prior to removing the hook cautery and the Endo Catch bag was then placed through the 12 mm port and the gallbladder was removed.  15 French round drain placed through right lower quadrant port and secured to skin using 3-0 nylon.  The gallbladder was passed off the table as a specimen. There was no evidence of bleeding from the gallbladder fossa or cystic artery or leakage of the bile from  the cystic duct stump. The 12 mm port site closed with Efx shield using 0 vicryl under direct vision.  Abdomen desufflated and secondary trocars were removed under direct vision. No bleeding was  noted. All skin incisions then closed with subcuticular sutures of 4-0 monocryl and dressed with topical skin adhesive. The orogastric tube was removed and patient extubated.  The patient tolerated the procedure well and was taken to the postanesthesia care unit in stable condition.  All sponge and instrument count correct at end of procedure.

## 2024-01-18 NOTE — Plan of Care (Signed)

## 2024-01-19 MED ORDER — ACETAMINOPHEN 325 MG PO TABS: 650.0000 mg | ORAL_TABLET | Freq: Three times a day (TID) | ORAL | 0 refills | Status: AC | PRN

## 2024-01-19 MED ORDER — ONDANSETRON 4 MG PO TBDP
4.0000 mg | ORAL_TABLET | Freq: Three times a day (TID) | ORAL | 0 refills | Status: AC | PRN
Start: 2024-01-19 — End: ?

## 2024-01-19 MED ORDER — DOCUSATE SODIUM 100 MG PO CAPS
100.0000 mg | ORAL_CAPSULE | Freq: Two times a day (BID) | ORAL | 0 refills | Status: AC | PRN
Start: 2024-01-19 — End: 2024-01-29

## 2024-01-19 MED ORDER — OXYCODONE-ACETAMINOPHEN 5-325 MG PO TABS: 1.0000 | ORAL_TABLET | Freq: Three times a day (TID) | ORAL | 0 refills | Status: AC | PRN

## 2024-01-19 MED ORDER — IBUPROFEN 800 MG PO TABS: 800.0000 mg | ORAL_TABLET | Freq: Three times a day (TID) | ORAL | 0 refills | Status: AC | PRN

## 2024-01-19 NOTE — Plan of Care (Signed)

## 2024-01-19 NOTE — TOC CM/SW Note (Signed)
 Transition of Care [facility]) - Inpatient Brief Assessment   Patient Details  Name: Tracey Fischer MRN: [id number] Date of Birth: 04/27/1961  [facility]) CM/SW Contact:    Chapman Fitch, RN Phone Number: 01/19/2024, 8:52 AM   Clinical Narrative:   Transition of Care [facility]) Screening Note   Patient Details  Name: Tracey Fischer Date of Birth: [birth year]   Transition of Care [facility]) CM/SW Contact:    Chapman Fitch, RN Phone Number: 01/19/2024, 8:52 AM    Transition of Care Department [facility]) has reviewed patient and no TOC needs have been identified at this time.  If new patient transition needs arise, please place a TOC consult.    Transition of Care Asessment: Insurance and Status: Insurance coverage has been reviewed Patient has primary care physician: Yes     Prior/Current Home Services: No current home services Social Drivers of Health Review: SDOH reviewed no interventions necessary Readmission risk has been reviewed: Yes Transition of care needs: no transition of care needs at this time

## 2024-01-19 NOTE — Progress Notes (Signed)
 AVS given and reviewed with pt. Medications discussed. All questions answered to satisfaction. Pt verbalized understanding of information given, including JP drain care. Pt to be escorted off the unit with all belongings via wheelchair by staff member.

## 2024-01-19 NOTE — Discharge Instructions (Signed)
 Laparoscopic Cholecystectomy, Care After This sheet gives you information about how to care for yourself after your procedure. Your doctor may also give you more specific instructions. If you have problems or questions, contact your doctor. Follow these instructions at home: Care for cuts from surgery (incisions)  Follow instructions from your doctor about how to take care of your cuts from surgery. Make sure you: Wash your hands with soap and water before you change your bandage (dressing). If you cannot use soap and water, use hand sanitizer. Change your bandage as told by your doctor. Leave stitches (sutures), skin glue, or skin tape (adhesive) strips in place. They may need to stay in place for 2 weeks or longer. If tape strips get loose and curl up, you may trim the loose edges. Do not remove tape strips completely unless your doctor says it is okay. Do not take baths, swim, or use a hot tub until your doctor says it is okay. OK TO SHOWER 24HRS AFTER YOUR SURGERY.  Check your surgical cut area every day for signs of infection. Check for: More redness, swelling, or pain. More fluid or blood. Warmth. Pus or a bad smell. Activity Do not drive or use heavy machinery while taking prescription pain medicine. Do not play contact sports until your doctor says it is okay. Do not drive for 24 hours if you were given a medicine to help you relax (sedative). Rest as needed. Do not return to work or school until your doctor says it is okay. General instructions  tylenol and advil as needed for discomfort.  Please alternate between the two every four hours as needed for pain.    Use narcotics, if prescribed, only when tylenol and motrin is not enough to control pain.  325-650mg  every 8hrs to max of 3000mg /24hrs (including the 325mg  in every norco dose) for the tylenol.    Advil up to 800mg  per dose every 8hrs as needed for pain.   To prevent or treat constipation while you are taking prescription  pain medicine, your doctor may recommend that you: Drink enough fluid to keep your pee (urine) clear or pale yellow. Take over-the-counter or prescription medicines. Eat foods that are high in fiber, such as fresh fruits and vegetables, whole grains, and beans. Limit foods that are high in fat and processed sugars, such as fried and sweet foods. Contact a doctor if: You develop a rash. You have more redness, swelling, or pain around your surgical cuts. You have more fluid or blood coming from your surgical cuts. Your surgical cuts feel warm to the touch. You have pus or a bad smell coming from your surgical cuts. You have a fever. One or more of your surgical cuts breaks open. You have trouble breathing. You have chest pain. You have pain that is getting worse in your shoulders. You faint or feel dizzy when you stand. You have very bad pain in your belly (abdomen). You are sick to your stomach (nauseous) for more than one day. You have throwing up (vomiting) that lasts for more than one day. You have leg pain. This information is not intended to replace advice given to you by your health care provider. Make sure you discuss any questions you have with your health care provider. Document Released: 08/06/2008 Document Revised: 05/18/2016 Document Reviewed: 04/15/2016 Elsevier Interactive Patient Education  2019 [employer].

## 2024-01-19 NOTE — Progress Notes (Signed)
 Pt reports she ate scrambled eggs then threw them up. PO PRN Zofran admin. Sung Amabile, DO notified; pt to attempt lunch.

## 2024-01-19 NOTE — Plan of Care (Signed)
  Problem: Pain Managment: Goal: General experience of comfort will improve and/or be controlled Outcome: Progressing   Problem: Safety: Goal: Ability to remain free from injury will improve Outcome: Progressing   Problem: Skin Integrity: Goal: Risk for impaired skin integrity will decrease Outcome: Progressing

## 2024-01-19 NOTE — Progress Notes (Signed)
 Pt ate 1 cup of potato soup. Denies nausea/vomiting. Sung Amabile, DO notified and stated okay to proceed with discharge to home.

## 2024-01-20 ENCOUNTER — Encounter: Payer: Self-pay | Admitting: Surgery

## 2024-01-20 LAB — SURGICAL PATHOLOGY

## 2024-01-20 NOTE — Discharge Summary (Signed)
 Physician Discharge Summary  Patient ID: Tracey Fischer MRN: [id number] DOB/AGE: 65-12-1960 65 y.o.  Admit date: 01/17/2024 Discharge date: 01/19/2024  Admission Diagnoses: Acute cholecystitis  Discharge Diagnoses:  Same as above  Discharged Condition: good  Hospital Course: admitted for above. Underwent surgery.  Please see op note for details.  Post op, recovered as expected.  At time of d/c, tolerating diet and pain controlled  Consults: None  Discharge Exam: Blood pressure 111/61, pulse 72, temperature 97.9 F (36.6 C), temperature source Oral, resp. rate 14, height 5\' 3"  (1.6 m), weight 92.5 kg, SpO2 95%. General appearance: alert, cooperative, and no distress GI: Soft, no guarding, minimal tenderness to palpation around incision sites.  JP with serosanguineous drainage.  Incisions clean dry and intact  Disposition:  Discharge disposition: 01-Home or Self Care       Discharge Instructions     Discharge patient   Complete by: As directed    Discharge disposition: 01-Home or Self Care   Discharge patient date: 01/19/2024      Allergies as of 01/19/2024       Reactions   Hydralazine Hcl Other (See Comments)   Made patient dizzy        Medication List     TAKE these medications    acetaminophen 325 MG tablet Commonly known as: Tylenol Take 2 tablets (650 mg total) by mouth every 8 (eight) hours as needed for mild pain (pain score 1-3).   Calcium Carbonate 500 MG Chew Chew 1 tablet by mouth once.   cholecalciferol 25 MCG (1000 UNIT) tablet Commonly known as: VITAMIN D3 Take 1,000 Units by mouth daily.   cyanocobalamin 1000 MCG tablet Commonly known as: VITAMIN B12 Take 1,000 mcg by mouth daily.   docusate sodium 100 MG capsule Commonly known as: Colace Take 1 capsule (100 mg total) by mouth 2 (two) times daily as needed for up to 10 days for mild constipation.   ibuprofen 800 MG tablet Commonly known as: ADVIL Take 1 tablet (800 mg total) by  mouth every 8 (eight) hours as needed for mild pain (pain score 1-3) or moderate pain (pain score 4-6).   isosorbide mononitrate 30 MG 24 hr tablet Commonly known as: IMDUR Take 30 mg by mouth daily.   losartan 50 MG tablet Commonly known as: COZAAR Take 50 mg by mouth daily.   ondansetron 4 MG disintegrating tablet Commonly known as: ZOFRAN-ODT Take 1 tablet (4 mg total) by mouth every 8 (eight) hours as needed for nausea or vomiting.   oxyCODONE-acetaminophen 5-325 MG tablet Commonly known as: Percocet Take 1 tablet by mouth every 8 (eight) hours as needed for severe pain (pain score 7-10).   QC TUMERIC COMPLEX PO Take 1 capsule by mouth daily.        Follow-up Information     [place], Jakoby Melendrez, DO. Go on 01/26/2024.   Specialties: General Surgery, Surgery Why: post op lap chole with drain. Go at  3:45pm. Contact information: [address] [place] [address] [phone]                  Total time spent arranging discharge was >35min. Signed: Sung Amabile 01/20/2024, 9:34 AM

## 2024-04-06 ENCOUNTER — Other Ambulatory Visit: Payer: Self-pay | Admitting: Family Medicine

## 2024-04-06 DIAGNOSIS — Z1231 Encounter for screening mammogram for malignant neoplasm of breast: Secondary | ICD-10-CM

## 2024-04-18 IMAGING — MG DIGITAL DIAGNOSTIC BILAT W/ TOMO W/ CAD
8 of 15 series · 8 of 40 positions shown · non-contrast
Comparison: Previous exam(s).

CLINICAL DATA: BI-RADS 3 follow-up of 2 RIGHT breast masses,
initiated 1134.

EXAM:
DIGITAL DIAGNOSTIC BILATERAL MAMMOGRAM WITH TOMOSYNTHESIS AND CAD;
ULTRASOUND RIGHT BREAST LIMITED
TECHNIQUE: Bilateral digital diagnostic mammography and breast tomosynthesis
was performed. The images were evaluated with computer-aided
detection.; Targeted ultrasound examination of the right breast was
performed

[L LM synth-2D]
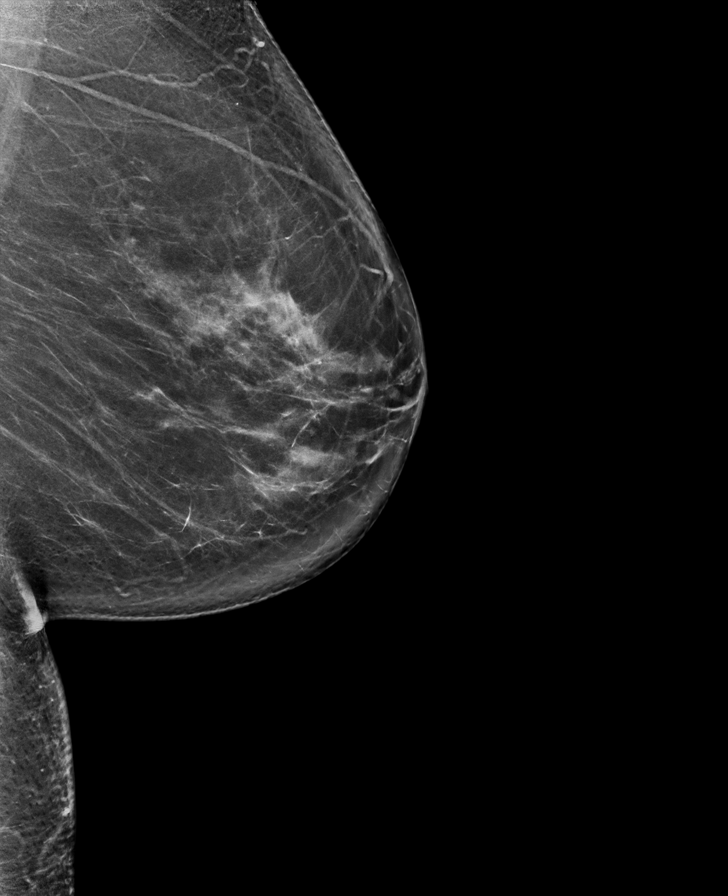

[L MLO synth-2D (1 of 2)]
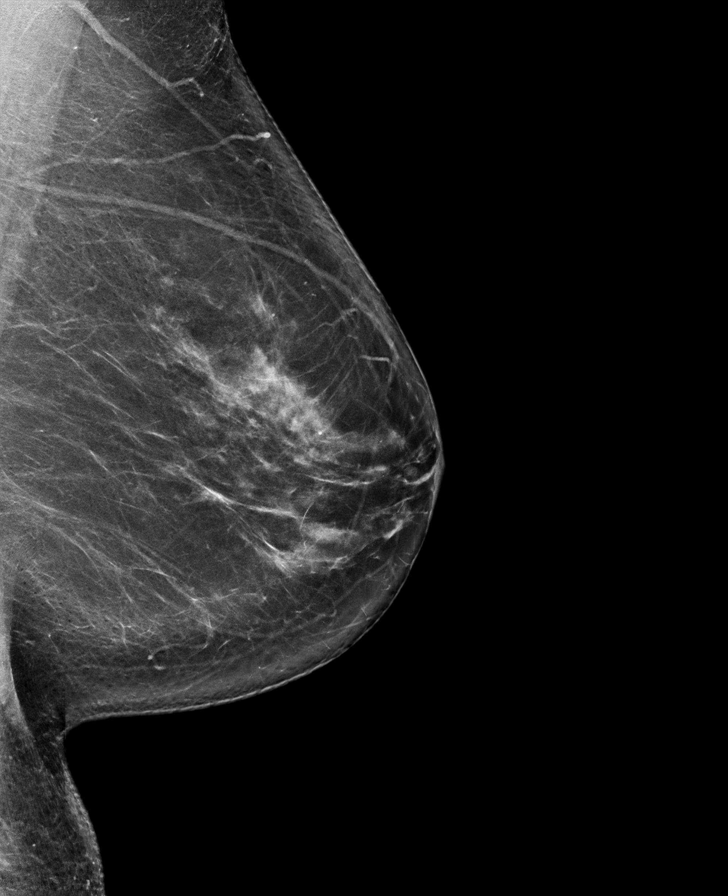

[L MLO synth-2D (2 of 2)]
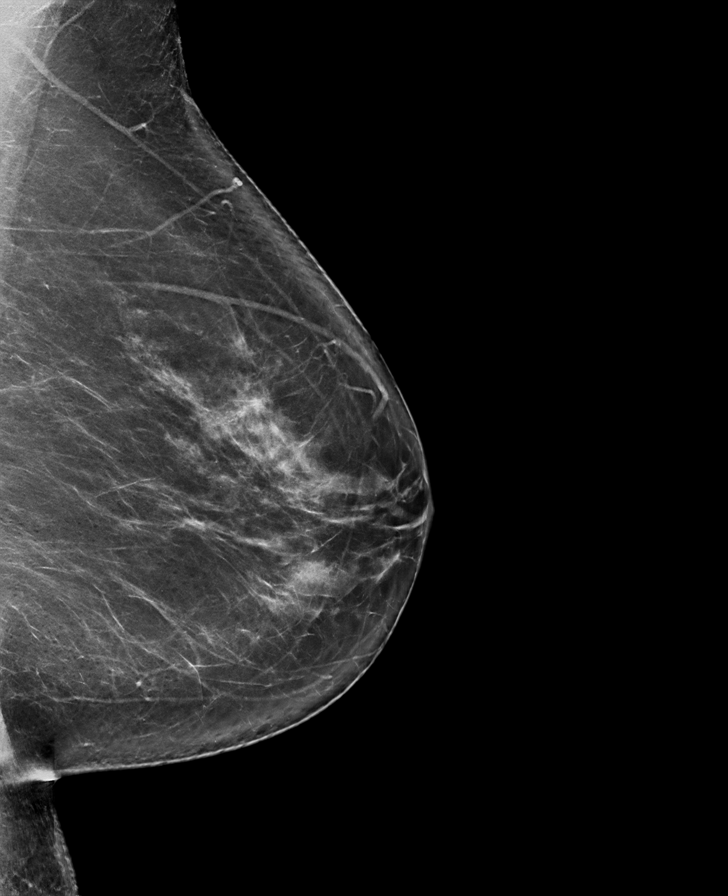

[L XCCL synth-2D (1 of 2)]
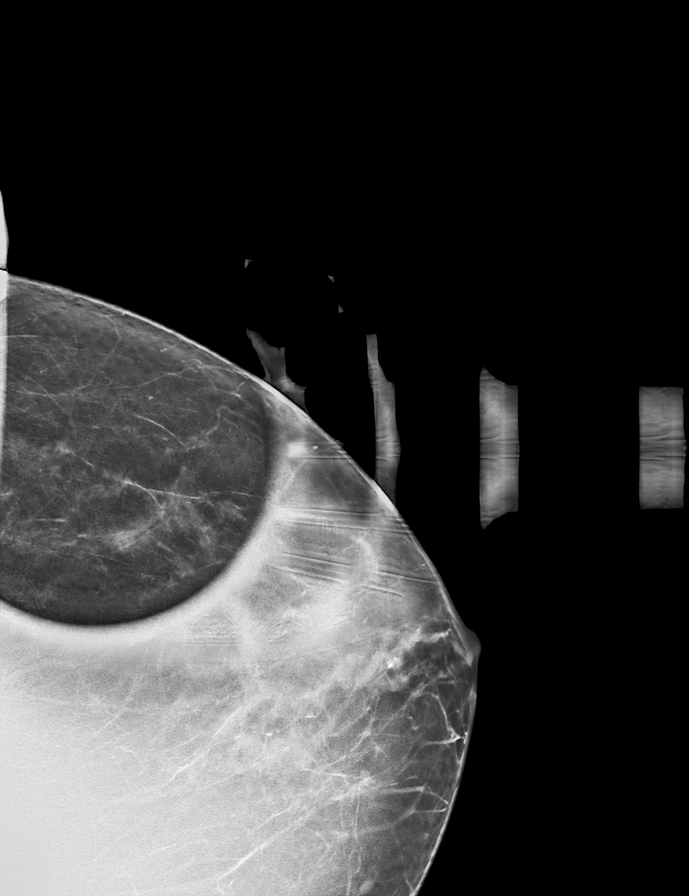

[R MLO synth-2D]
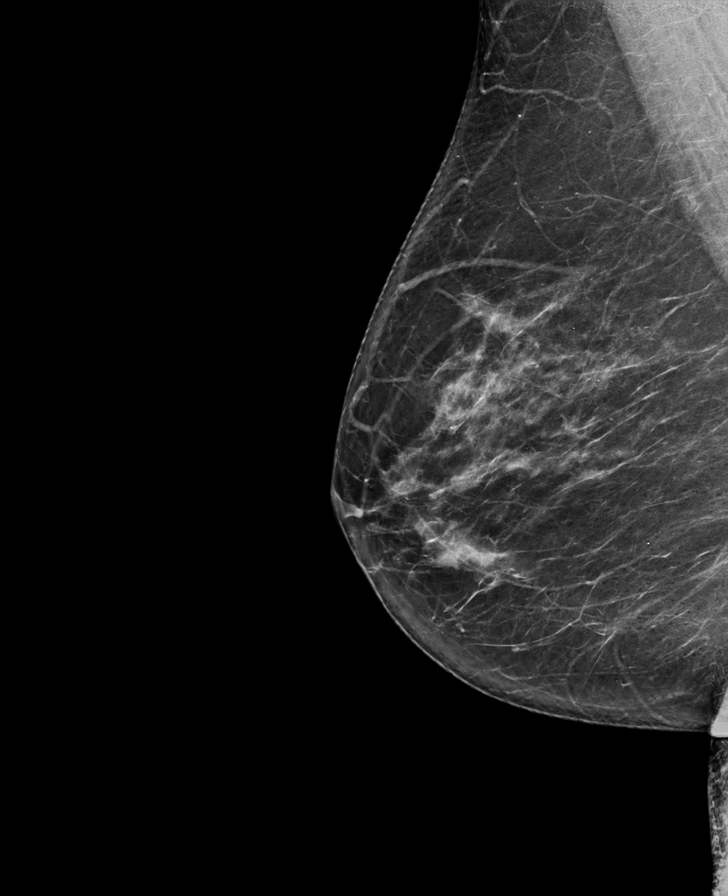

[L XCCL synth-2D (2 of 2)]
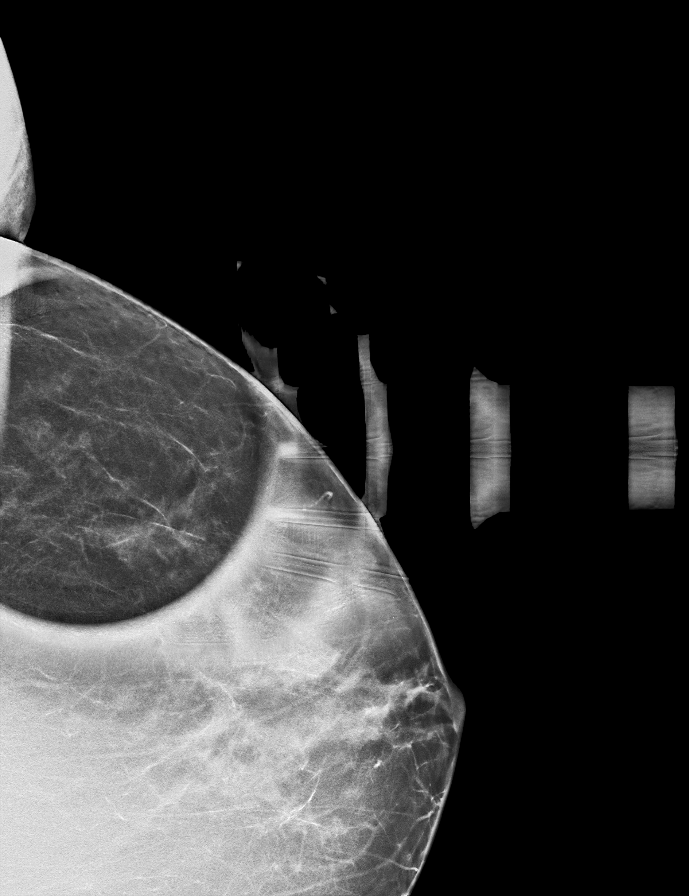

[L CC synth-2D]
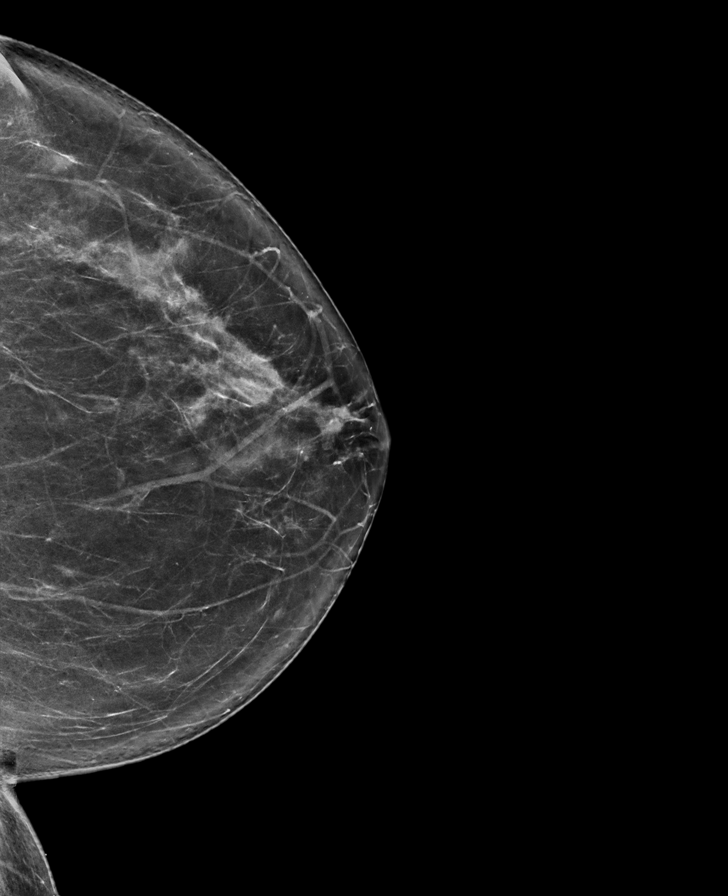

[L LM tomo · tomo slice 61/89.0]
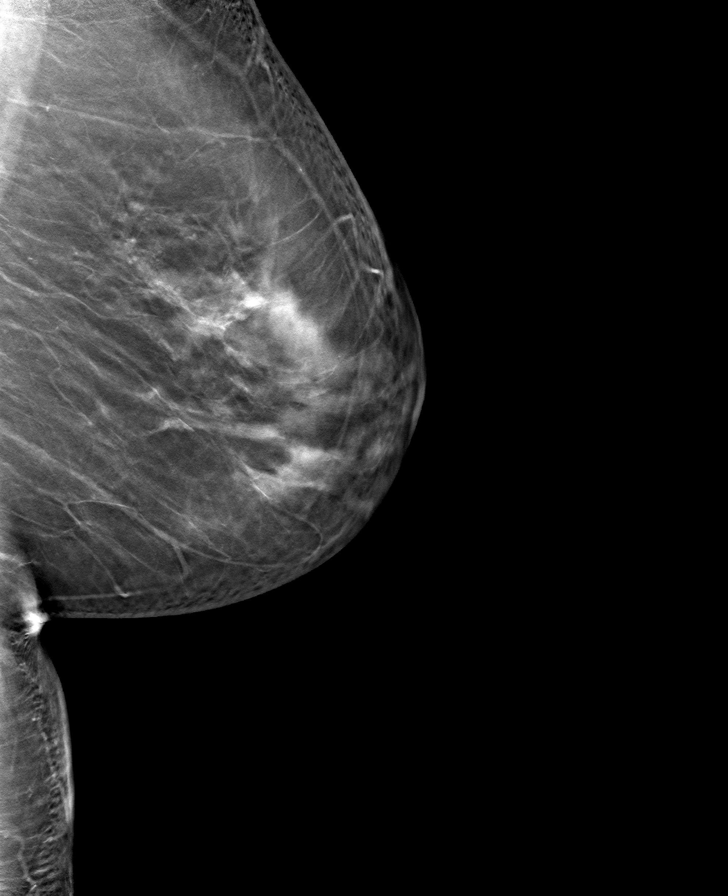

[8 of 40 positions shown; findings below may reference images not displayed]

ACR Breast Density Category c: The breast tissue is heterogeneously
dense, which may obscure small masses.
FINDINGS: Diagnostic images demonstrate decreased conspicuity of an oval
obscured mass in the RIGHT outer breast at anterior to middle depth.
No new suspicious findings are noted in the RIGHT breast. A
questioned asymmetry in the LEFT breast resolves with additional
views, consistent with overlapping tissue. No suspicious mass,
distortion, or microcalcifications are identified to suggest
presence of malignancy.

Targeted ultrasound was performed of the RIGHT outer breast. At 9
o'clock 4 cm from the nipple, previously described mass is no longer
visualized. At 10 o'clock 5 cm from the nipple, there is an oval
circumscribed anechoic mass with posterior acoustic enhancement. It
measures 5 x 3 x 4 mm, previously 7 by 7 x 4 mm. This is consistent
with a benign cyst.
IMPRESSION: No mammographic evidence of malignancy bilaterally. Benign cysts are
noted.

RECOMMENDATION:
Screening mammogram in one year.(Code:I5-B-FYD)

I have discussed the findings and recommendations with the patient.
If applicable, a reminder letter will be sent to the patient
regarding the next appointment.

BI-RADS CATEGORY  2: Benign.

## 2024-04-18 IMAGING — US US BREAST*R* LIMITED INC AXILLA
1 series · 7 of 7 positions shown · non-contrast
Comparison: Previous exam(s).

CLINICAL DATA: BI-RADS 3 follow-up of 2 RIGHT breast masses,
initiated 1134.

EXAM:
DIGITAL DIAGNOSTIC BILATERAL MAMMOGRAM WITH TOMOSYNTHESIS AND CAD;
ULTRASOUND RIGHT BREAST LIMITED
TECHNIQUE: Bilateral digital diagnostic mammography and breast tomosynthesis
was performed. The images were evaluated with computer-aided
detection.; Targeted ultrasound examination of the right breast was
performed

[Series 1: us breast*right* limited inc axilla · 0.05mm/px · 7 of 7 slices shown]
[im 1/7]
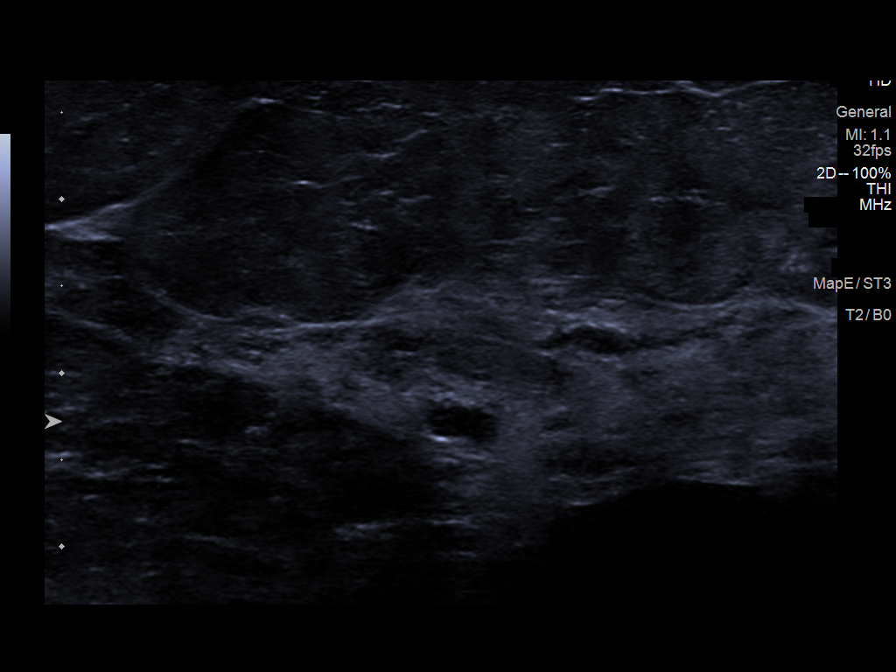
[im 2/7]
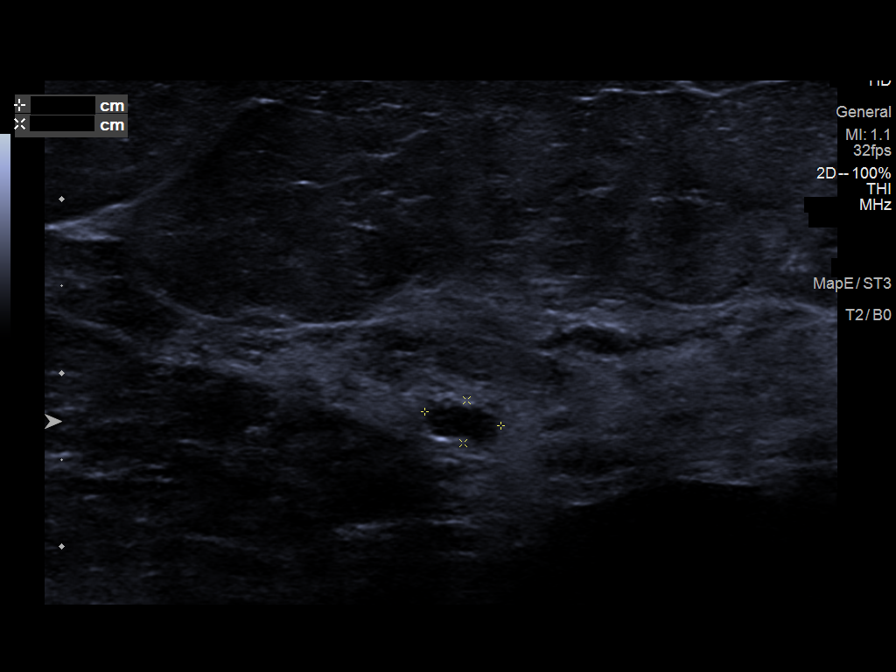
[im 3/7]
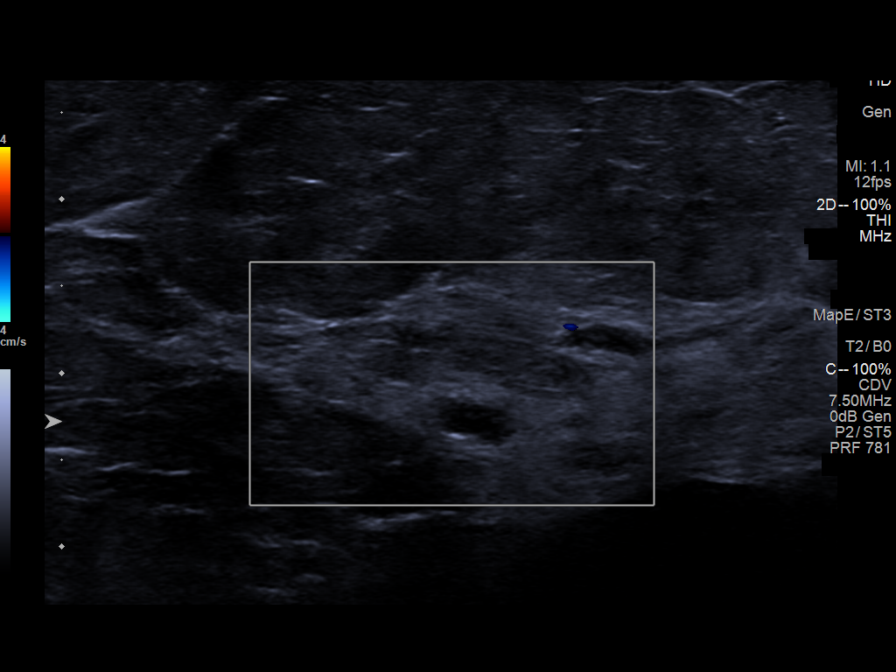
[im 4/7]
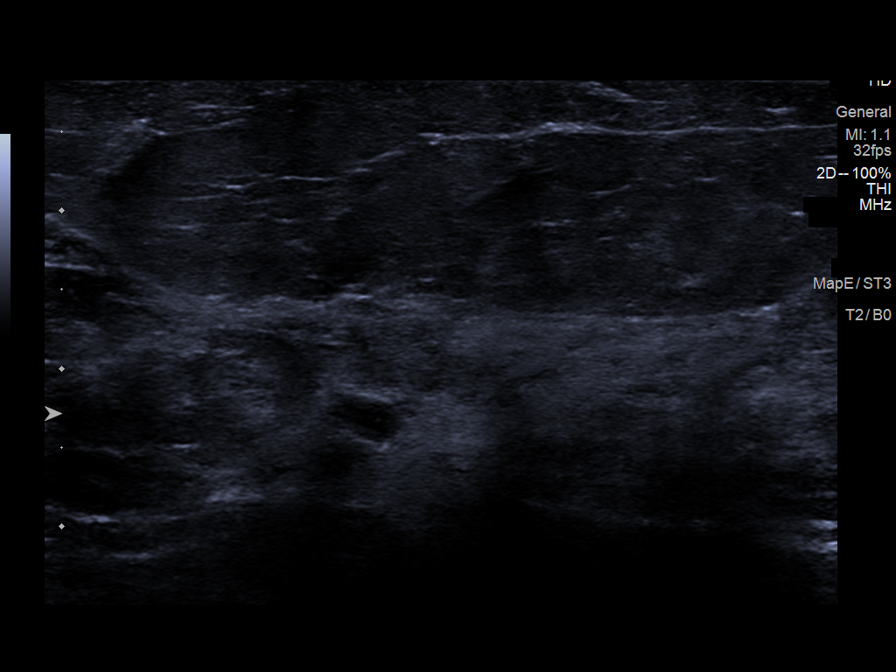
[im 5/7]
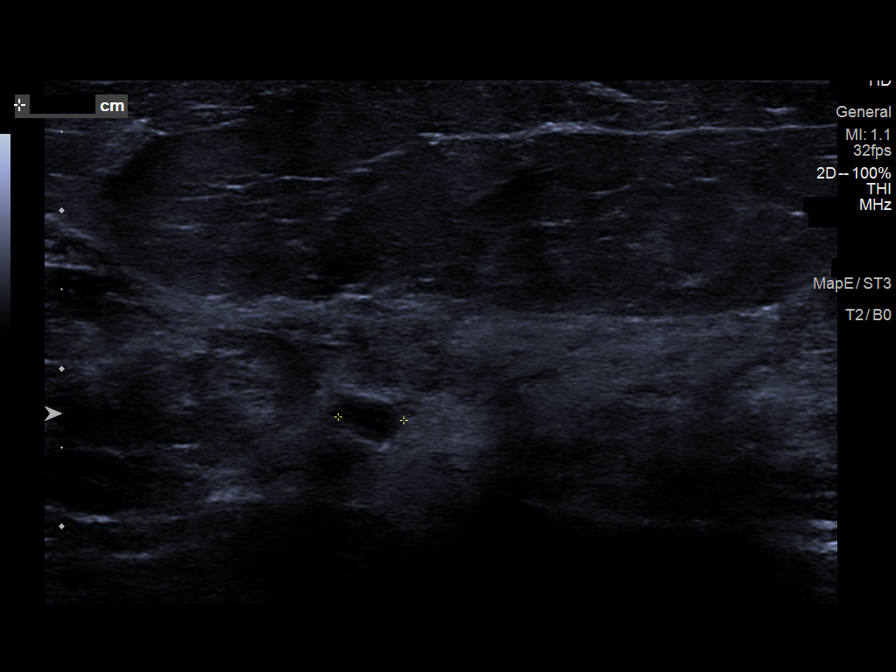
[im 6/7]
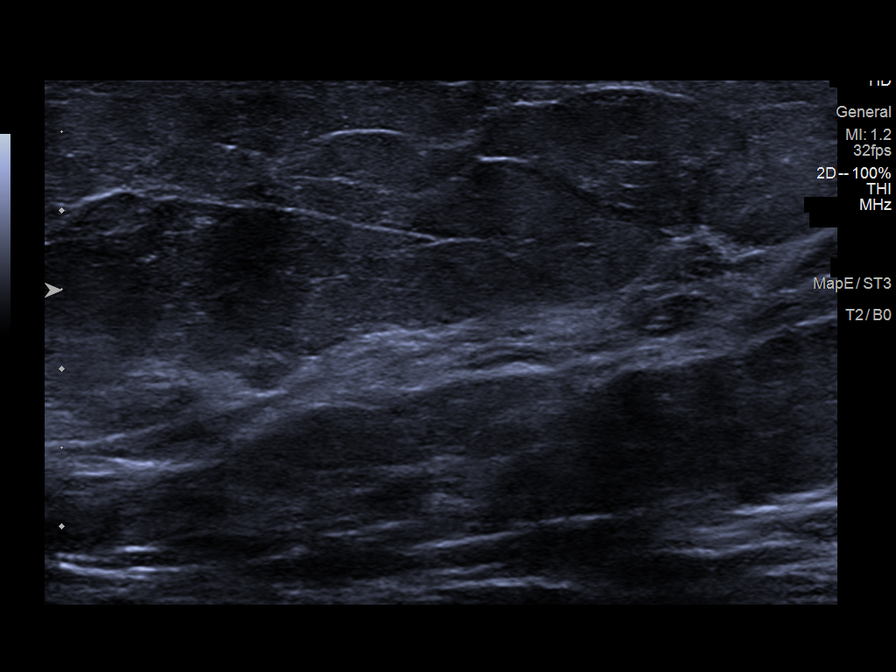
[im 7/7]
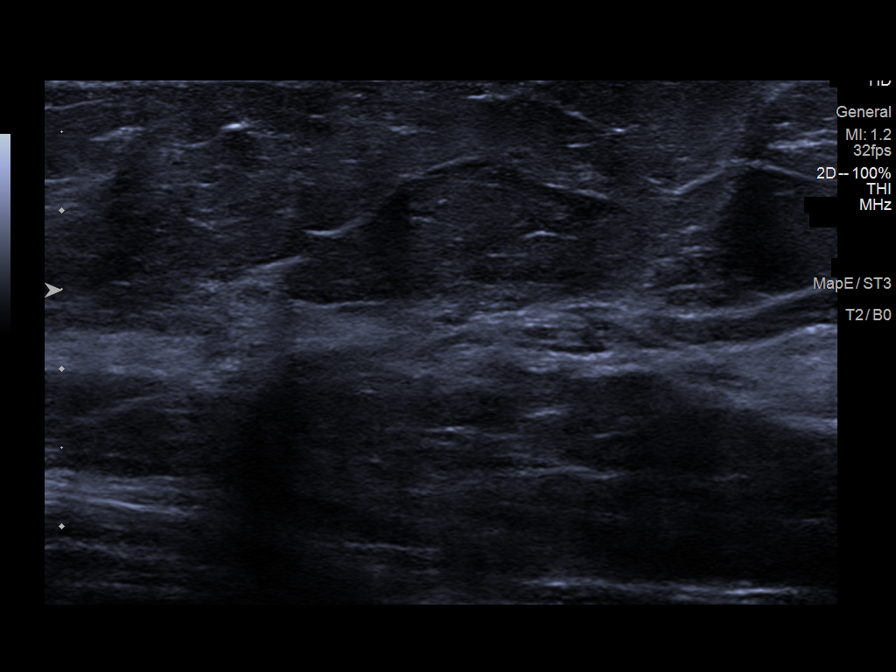

[7 of 7 positions shown; findings below may reference images not displayed]

ACR Breast Density Category c: The breast tissue is heterogeneously
dense, which may obscure small masses.
FINDINGS: Diagnostic images demonstrate decreased conspicuity of an oval
obscured mass in the RIGHT outer breast at anterior to middle depth.
No new suspicious findings are noted in the RIGHT breast. A
questioned asymmetry in the LEFT breast resolves with additional
views, consistent with overlapping tissue. No suspicious mass,
distortion, or microcalcifications are identified to suggest
presence of malignancy.

Targeted ultrasound was performed of the RIGHT outer breast. At 9
o'clock 4 cm from the nipple, previously described mass is no longer
visualized. At 10 o'clock 5 cm from the nipple, there is an oval
circumscribed anechoic mass with posterior acoustic enhancement. It
measures 5 x 3 x 4 mm, previously 7 by 7 x 4 mm. This is consistent
with a benign cyst.
IMPRESSION: No mammographic evidence of malignancy bilaterally. Benign cysts are
noted.

RECOMMENDATION:
Screening mammogram in one year.(Code:I5-B-FYD)

I have discussed the findings and recommendations with the patient.
If applicable, a reminder letter will be sent to the patient
regarding the next appointment.

BI-RADS CATEGORY  2: Benign.

## 2024-04-21 ENCOUNTER — Ambulatory Visit
Admission: RE | Admit: 2024-04-21 | Discharge: 2024-04-21 | Disposition: A | Discharge status: Home / Self Care | Source: Ambulatory Visit | Attending: Family Medicine | Admitting: Family Medicine

## 2024-04-21 DIAGNOSIS — Z1231 Encounter for screening mammogram for malignant neoplasm of breast: Secondary | ICD-10-CM | POA: Diagnosis present

## 2024-09-14 ENCOUNTER — Ambulatory Visit (INDEPENDENT_AMBULATORY_CARE_PROVIDER_SITE_OTHER): Payer: BC Managed Care – PPO | Admitting: Dermatology

## 2024-09-14 DIAGNOSIS — L820 Inflamed seborrheic keratosis: Secondary | ICD-10-CM | POA: Diagnosis not present

## 2024-09-14 DIAGNOSIS — D1801 Hemangioma of skin and subcutaneous tissue: Secondary | ICD-10-CM

## 2024-09-14 DIAGNOSIS — D229 Melanocytic nevi, unspecified: Secondary | ICD-10-CM

## 2024-09-14 DIAGNOSIS — L821 Other seborrheic keratosis: Secondary | ICD-10-CM | POA: Diagnosis not present

## 2024-09-14 DIAGNOSIS — L814 Other melanin hyperpigmentation: Secondary | ICD-10-CM | POA: Diagnosis not present

## 2024-09-14 DIAGNOSIS — Z1283 Encounter for screening for malignant neoplasm of skin: Secondary | ICD-10-CM

## 2024-09-14 DIAGNOSIS — R238 Other skin changes: Secondary | ICD-10-CM

## 2024-09-14 DIAGNOSIS — D235 Other benign neoplasm of skin of trunk: Secondary | ICD-10-CM

## 2024-09-14 DIAGNOSIS — W908XXA Exposure to other nonionizing radiation, initial encounter: Secondary | ICD-10-CM

## 2024-09-14 DIAGNOSIS — D3610 Benign neoplasm of peripheral nerves and autonomic nervous system, unspecified: Secondary | ICD-10-CM

## 2024-09-14 DIAGNOSIS — L578 Other skin changes due to chronic exposure to nonionizing radiation: Secondary | ICD-10-CM | POA: Diagnosis not present

## 2024-09-14 DIAGNOSIS — I781 Nevus, non-neoplastic: Secondary | ICD-10-CM

## 2024-09-14 DIAGNOSIS — B353 Tinea pedis: Secondary | ICD-10-CM

## 2024-09-14 MED ORDER — CICLOPIROX OLAMINE 0.77 % EX CREA: TOPICAL_CREAM | CUTANEOUS | 1 refills | Status: AC

## 2024-09-14 NOTE — Progress Notes (Signed)
 Follow-Up Visit   Subjective  Tracey Fischer is a 63 y.o. female who presents for the following: Skin Cancer Screening and Full Body Skin Exam  The patient presents for Total-Body Skin Exam (TBSE) for skin cancer screening and mole check. The patient has spots, moles and lesions to be evaluated, some may be new or changing. Spot on the left lower leg, hits with shaving.      The following portions of the chart were reviewed this encounter and updated as appropriate: medications, allergies, medical history  Review of Systems:  No other skin or systemic complaints except as noted in HPI or Assessment and Plan.  Objective  Well appearing patient in no apparent distress; mood and affect are within normal limits.  A full examination was performed including scalp, head, eyes, ears, nose, lips, neck, chest, axillae, abdomen, back, buttocks, bilateral upper extremities, bilateral lower extremities, hands, feet, fingers, toes, fingernails, and toenails. All findings within normal limits unless otherwise noted below.   Relevant physical exam findings are noted in the Assessment and Plan.  R elbow x 1, L lower pretibia x 1 (2) Erythematous stuck-on, waxy papule  Assessment & Plan   SKIN CANCER SCREENING PERFORMED TODAY.  ACTINIC DAMAGE - Chronic condition, secondary to cumulative UV/sun exposure - diffuse scaly erythematous macules with underlying dyspigmentation - Recommend daily broad spectrum sunscreen SPF 30+ to sun-exposed areas, reapply every 2 hours as needed.  - Staying in the shade or wearing long sleeves, sun glasses (UVA+UVB protection) and wide brim hats (4-inch brim around the entire circumference of the hat) are also recommended for sun protection.  - Call for new or changing lesions.  LENTIGINES, SEBORRHEIC KERATOSES, HEMANGIOMAS - Benign normal skin lesions - Benign-appearing - Call for any changes  MELANOCYTIC NEVI - Tan-brown and/or pink-flesh-colored symmetric  macules and papules - Benign appearing on exam today - Observation - Call clinic for new or changing moles - Recommend daily use of broad spectrum spf 30+ sunscreen to sun-exposed areas.   NEUROFIBROMA Exam:   soft flesh papule - right upper chest   Benign-appearing.  Observation.  Call clinic for new or changing lesions.  Recommend daily use of broad spectrum spf 30+ sunscreen to sun-exposed areas.   TINEA PEDIS Exam: Scaling and maceration web spaces and over distal and lateral soles.  Treatment Plan: Start ciclopirox cream apply to feet and between toes bid x 4 weeks. Start Amlactin cream 1-2x daily  [place] Exam: small purple papule 1.5 mm at central lower lip   Treatment Plan: Benign-appearing. Observe  INFLAMED SEBORRHEIC KERATOSIS (2) R elbow x 1, L lower pretibia x 1 (2) Symptomatic, irritating, patient would like treated. Destruction of lesion - R elbow x 1, L lower pretibia x 1 (2)  Destruction method: cryotherapy   Informed consent: discussed and consent obtained   Lesion destroyed using liquid nitrogen: Yes   Region frozen until ice ball extended beyond lesion: Yes   Outcome: patient tolerated procedure well with no complications   Post-procedure details: wound care instructions given   Additional details:  Prior to procedure, discussed risks of blister formation, small wound, skin dyspigmentation, or rare scar following cryotherapy. Recommend Vaseline ointment to treated areas while healing.   Return in about 1 year (around 09/14/2025) for TBSE.  IAndrea Kerns, CMA, am acting as scribe for Rexene Rattler, MD .   Documentation: I have reviewed the above documentation for accuracy and completeness, and I agree with the above.  Rexene Rattler,  MD

## 2024-09-14 NOTE — Patient Instructions (Addendum)
 Recommend starting moisturizer with exfoliant (Urea, Salicylic acid, or Lactic acid) one to two times daily to help smooth rough and bumpy skin.  OTC options include Cetaphil Rough and Bumpy lotion (Urea), Eucerin Roughness Relief lotion or spot treatment cream (Urea), CeraVe SA lotion/cream for Rough and Bumpy skin (Sal Acid), Gold Bond Rough and Bumpy cream (Sal Acid), and AmLactin 12% lotion/cream (Lactic Acid).  If applying in morning, also apply sunscreen to sun-exposed areas, since these exfoliating moisturizers can increase sensitivity to sun.  Cryotherapy Aftercare  Wash gently with soap and water everyday.   Apply Vaseline and Band-Aid daily until healed.   Seborrheic Keratosis  What causes seborrheic keratoses? Seborrheic keratoses are harmless, common skin growths that first appear during adult life.  As time goes by, more growths appear.  Some people may develop a large number of them.  Seborrheic keratoses appear on both covered and uncovered body parts.  They are not caused by sunlight.  The tendency to develop seborrheic keratoses can be inherited.  They vary in color from skin-colored to gray, brown, or even black.  They can be either smooth or have a rough, warty surface.   Seborrheic keratoses are superficial and look as if they were stuck on the skin.  Under the microscope this type of keratosis looks like layers upon layers of skin.  That is why at times the top layer may seem to fall off, but the rest of the growth remains and re-grows.    Treatment Seborrheic keratoses do not need to be treated, but can easily be removed in the office.  Seborrheic keratoses often cause symptoms when they rub on clothing or jewelry.  Lesions can be in the way of shaving.  If they become inflamed, they can cause itching, soreness, or burning.  Removal of a seborrheic keratosis can be accomplished by freezing, burning, or surgery. If any spot bleeds, scabs, or grows rapidly, please return to have it  checked, as these can be an indication of a skin cancer.  Gentle Skin Care Guide  1. Bathe no more than once a day.  2. Avoid bathing in hot water  3. Use a mild soap like Dove, Vanicream, Cetaphil, CeraVe. Can use Lever 2000 or Cetaphil antibacterial soap  4. Use soap only where you need it. On most days, use it under your arms, between your legs, and on your feet. Let the water rinse other areas unless visibly dirty.  5. When you get out of the bath/shower, use a towel to gently blot your skin dry, don't rub it.  6. While your skin is still a little damp, apply a moisturizing cream such as Vanicream, CeraVe, Cetaphil, Eucerin, Sarna lotion or plain Vaseline Jelly. For hands apply Neutrogena Norwegian Hand Cream or Excipial Hand Cream.  7. Reapply moisturizer any time you start to itch or feel dry.  8. Sometimes using free and clear laundry detergents can be helpful. Fabric softener sheets should be avoided. Downy Free & Gentle liquid, or any liquid fabric softener that is free of dyes and perfumes, it acceptable to use  9. If your doctor has given you prescription creams you may apply moisturizers over them     Due to recent changes in healthcare laws, you may see results of your pathology and/or laboratory studies on MyChart before the doctors have had a chance to review them. We understand that in some cases there may be results that are confusing or concerning to you. Please understand that not all  results are received at the same time and often the doctors may need to interpret multiple results in order to provide you with the best plan of care or course of treatment. Therefore, we ask that you please give us  2 business days to thoroughly review all your results before contacting the office for clarification. Should we see a critical lab result, you will be contacted sooner.   If You Need Anything After Your Visit  If you have any questions or concerns for your doctor, please call  our main line at [phone] and press option 4 to reach your doctor's medical assistant. If no one answers, please leave a voicemail as directed and we will return your call as soon as possible. Messages left after 4 pm will be answered the following business day.   You may also send us  a message via MyChart. We typically respond to MyChart messages within 1-2 business days.  For prescription refills, please ask your pharmacy to contact our office. Our fax number is [phone].  If you have an urgent issue when the clinic is closed that cannot wait until the next business day, you can page your doctor at the number below.    Please note that while we do our best to be available for urgent issues outside of office hours, we are not available 24/7.   If you have an urgent issue and are unable to reach us , you may choose to seek medical care at your doctor's office, retail clinic, urgent care center, or emergency room.  If you have a medical emergency, please immediately call 911 or go to the emergency department.  Pager Numbers  - Dr. Hester: [phone]  - Dr. Jackquline: [phone]  - Dr. Claudene: [phone]   - Dr. Raymund: [phone]  In the event of inclement weather, please call our main line at [phone] for an update on the status of any delays or closures.  Dermatology Medication Tips: Please keep the boxes that topical medications come in in order to help keep track of the instructions about where and how to use these. Pharmacies typically print the medication instructions only on the boxes and not directly on the medication tubes.   If your medication is too expensive, please contact our office at [phone] option 4 or send us  a message through MyChart.   We are unable to tell what your co-pay for medications will be in advance as this is different depending on your insurance coverage. However, we may be able to find a substitute medication at lower cost or fill  out paperwork to get insurance to cover a needed medication.   If a prior authorization is required to get your medication covered by your insurance company, please allow us  1-2 business days to complete this process.  Drug prices often vary depending on where the prescription is filled and some pharmacies may offer cheaper prices.  The website www.goodrx.com contains coupons for medications through different pharmacies. The prices here do not account for what the cost may be with help from insurance (it may be cheaper with your insurance), but the website can give you the price if you did not use any insurance.  - You can print the associated coupon and take it with your prescription to the pharmacy.  - You may also stop by our office during regular business hours and pick up a GoodRx coupon card.  - If you need your prescription sent electronically to a different pharmacy, notify our office through [facility]  MyChart or by phone at [phone] option 4.     Si Usted Necesita Algo Despus de Su Visita  Tambin puede enviarnos un mensaje a travs de Clinical Cytogeneticist. Por lo general respondemos a los mensajes de MyChart en el transcurso de 1 a 2 das hbiles.  Para renovar recetas, por favor pida a su farmacia que se ponga en contacto con nuestra oficina. Randi lakes de fax es [place] [phone].  Si tiene un asunto urgente cuando la clnica est cerrada y que no puede esperar hasta el siguiente da hbil, puede llamar/localizar a su doctor(a) al nmero que aparece a continuacin.   Por favor, tenga en cuenta que aunque hacemos todo lo posible para estar disponibles para asuntos urgentes fuera del horario de [place], no estamos disponibles las 24 horas del da, los 7 [address]  [address] de la [place].   Si tiene un problema urgente y no puede comunicarse con nosotros, puede optar por buscar atencin mdica  en el consultorio de su doctor(a), en una clnica privada, en un centro de atencin urgente o en una sala de  emergencias.  Si tiene engineer, drilling, por favor llame inmediatamente al 911 o vaya a la sala de emergencias.  Nmeros de bper  - Dr. Hester: [phone]  - Dra. Jackquline: [phone]  - Dr. Claudene: [phone]  - Dra. Kitts: [phone]  En caso de inclemencias del [place], por favor llame a nuestra lnea principal al [phone] para una actualizacin sobre el estado de cualquier retraso o cierre.  Consejos para la medicacin en dermatologa: Por favor, guarde las cajas en las que vienen los medicamentos de uso tpico para ayudarle a seguir las instrucciones sobre dnde y cmo usarlos. Las farmacias generalmente imprimen las instrucciones del medicamento slo en las cajas y no directamente en los tubos del [place].   Si su medicamento es muy caro, por favor, pngase en contacto con landry rieger llamando al [phone] y presione la opcin 4 o envenos un mensaje a travs de Clinical Cytogeneticist.   No podemos decirle cul ser su copago por los medicamentos por adelantado ya que esto es diferente dependiendo de la cobertura de su seguro. Sin embargo, es posible que podamos encontrar un medicamento sustituto a audiological scientist un formulario para que el seguro cubra el medicamento que se considera necesario.   Si se requiere una autorizacin previa para que su compaa de seguros cubra su medicamento, por favor permtanos de 1 a 2 das hbiles para completar este proceso.  Los precios de los medicamentos varan con frecuencia dependiendo del environmental consultant de dnde se surte la receta y alguna farmacias pueden ofrecer precios ms baratos.  El sitio web www.goodrx.com tiene cupones para medicamentos de health and safety inspector. Los precios aqu no tienen en cuenta lo que podra costar con la ayuda del seguro (puede ser ms barato con su seguro), pero el sitio web puede darle el precio si no utiliz tourist information centre manager.  - Puede imprimir el cupn correspondiente y llevarlo con su receta a la  farmacia.  - Tambin puede pasar por nuestra oficina durante el horario de atencin regular y education officer, museum una tarjeta de cupones de GoodRx.  - Si necesita que su receta se enve electrnicamente a una farmacia diferente, informe a nuestra oficina a travs de MyChart de [place] o por telfono llamando al [phone] y presione la opcin 4.

## 2024-12-14 ENCOUNTER — Other Ambulatory Visit: Payer: Self-pay | Admitting: Nurse Practitioner

## 2024-12-14 DIAGNOSIS — I10 Essential (primary) hypertension: Secondary | ICD-10-CM

## 2024-12-14 DIAGNOSIS — R079 Chest pain, unspecified: Secondary | ICD-10-CM

## 2024-12-14 DIAGNOSIS — R0609 Other forms of dyspnea: Secondary | ICD-10-CM

## 2024-12-16 ENCOUNTER — Ambulatory Visit: Admission: RE | Admit: 2024-12-16

## 2024-12-16 DIAGNOSIS — R0609 Other forms of dyspnea: Secondary | ICD-10-CM

## 2024-12-16 DIAGNOSIS — I10 Essential (primary) hypertension: Secondary | ICD-10-CM

## 2024-12-16 DIAGNOSIS — R079 Chest pain, unspecified: Secondary | ICD-10-CM

## 2024-12-16 LAB — NM PET CT CARDIAC PERFUSION MULTI W/ABSOLUTE BLOODFLOW
MBFR: 1.82
Nuc Rest EF: 66 %
Nuc Stress EF: 69 %
Peak HR: 99 {beats}/min
Rest HR: 83 {beats}/min
Rest MBF: 1.1 ml/g/min
Rest Nuclear Isotope Dose: 24.1 mCi
SRS: 0
SSS: 0
ST Depression (mm): 1.5 mm
Stress MBF: 2 ml/g/min
Stress Nuclear Isotope Dose: 24.1 mCi
TID: 1

## 2024-12-16 MED ORDER — REGADENOSON 0.4 MG/5ML IV SOLN
INTRAVENOUS | Status: AC
  Filled 2024-12-16: qty 5

## 2024-12-16 MED ORDER — RUBIDIUM RB82 GENERATOR (RUBYFILL)
25.0000 | PACK | Freq: Once | INTRAVENOUS | Status: AC
  Administered 2024-12-16: 24.06 via INTRAVENOUS

## 2024-12-16 MED ORDER — REGADENOSON 0.4 MG/5ML IV SOLN
0.4000 mg | Freq: Once | INTRAVENOUS | Status: AC
  Administered 2024-12-16: 0.4 mg via INTRAVENOUS
  Filled 2024-12-16: qty 5

## 2024-12-16 MED ORDER — RUBIDIUM RB82 GENERATOR (RUBYFILL)
25.0000 | PACK | Freq: Once | INTRAVENOUS | Status: AC
  Administered 2024-12-16: 24.11 via INTRAVENOUS

## 2024-12-30 ENCOUNTER — Ambulatory Visit: Payer: PRIVATE HEALTH INSURANCE

## 2025-09-19 ENCOUNTER — Encounter: Payer: PRIVATE HEALTH INSURANCE | Admitting: Dermatology
